# Patient Record
Sex: Male | Born: 1945 | Race: White | Hispanic: No | Marital: Married | Smoking: Former smoker
Health system: Southern US, Community
[De-identification: ages and names within clinical notes are randomized; demographics above are authoritative.]

## PROBLEM LIST (undated history)

## (undated) DIAGNOSIS — J9621 Acute and chronic respiratory failure with hypoxia: Secondary | ICD-10-CM

## (undated) DIAGNOSIS — U071 COVID-19: Secondary | ICD-10-CM

## (undated) DIAGNOSIS — K922 Gastrointestinal hemorrhage, unspecified: Secondary | ICD-10-CM

## (undated) DIAGNOSIS — J449 Chronic obstructive pulmonary disease, unspecified: Secondary | ICD-10-CM

## (undated) HISTORY — PX: AORTIC VALVE REPAIR: SHX6306

---

## 2015-12-01 DIAGNOSIS — Z952 Presence of prosthetic heart valve: Secondary | ICD-10-CM

## 2015-12-01 HISTORY — DX: Presence of prosthetic heart valve: Z95.2

## 2016-05-05 DIAGNOSIS — I359 Nonrheumatic aortic valve disorder, unspecified: Secondary | ICD-10-CM | POA: Diagnosis present

## 2019-06-04 DIAGNOSIS — R911 Solitary pulmonary nodule: Secondary | ICD-10-CM | POA: Insufficient documentation

## 2020-04-28 DIAGNOSIS — I639 Cerebral infarction, unspecified: Secondary | ICD-10-CM

## 2020-04-28 HISTORY — DX: Cerebral infarction, unspecified: I63.9

## 2020-05-26 DIAGNOSIS — F17200 Nicotine dependence, unspecified, uncomplicated: Secondary | ICD-10-CM

## 2020-05-26 DIAGNOSIS — I639 Cerebral infarction, unspecified: Secondary | ICD-10-CM | POA: Insufficient documentation

## 2020-05-26 DIAGNOSIS — C3492 Malignant neoplasm of unspecified part of left bronchus or lung: Secondary | ICD-10-CM | POA: Insufficient documentation

## 2020-05-26 DIAGNOSIS — E871 Hypo-osmolality and hyponatremia: Secondary | ICD-10-CM | POA: Diagnosis present

## 2020-05-26 HISTORY — DX: Nicotine dependence, unspecified, uncomplicated: F17.200

## 2020-05-26 HISTORY — DX: Malignant neoplasm of unspecified part of left bronchus or lung: C34.92

## 2020-05-29 DIAGNOSIS — Z8546 Personal history of malignant neoplasm of prostate: Secondary | ICD-10-CM | POA: Insufficient documentation

## 2020-05-29 HISTORY — DX: Personal history of malignant neoplasm of prostate: Z85.46

## 2020-11-06 ENCOUNTER — Encounter (HOSPITAL_COMMUNITY): Payer: Self-pay

## 2020-11-06 ENCOUNTER — Other Ambulatory Visit: Payer: Self-pay

## 2020-11-06 ENCOUNTER — Emergency Department (HOSPITAL_COMMUNITY): Payer: No Typology Code available for payment source

## 2020-11-06 ENCOUNTER — Inpatient Hospital Stay (HOSPITAL_COMMUNITY)
Admission: EM | Admit: 2020-11-06 | Discharge: 2020-11-12 | DRG: 177 | Disposition: A | Discharge status: Home Health | Payer: No Typology Code available for payment source | Attending: Internal Medicine | Admitting: Internal Medicine

## 2020-11-06 DIAGNOSIS — U071 COVID-19: Secondary | ICD-10-CM | POA: Diagnosis not present

## 2020-11-06 DIAGNOSIS — Z8546 Personal history of malignant neoplasm of prostate: Secondary | ICD-10-CM

## 2020-11-06 DIAGNOSIS — J9601 Acute respiratory failure with hypoxia: Secondary | ICD-10-CM | POA: Diagnosis present

## 2020-11-06 DIAGNOSIS — H409 Unspecified glaucoma: Secondary | ICD-10-CM | POA: Diagnosis present

## 2020-11-06 DIAGNOSIS — E871 Hypo-osmolality and hyponatremia: Secondary | ICD-10-CM | POA: Diagnosis present

## 2020-11-06 DIAGNOSIS — R739 Hyperglycemia, unspecified: Secondary | ICD-10-CM | POA: Diagnosis present

## 2020-11-06 DIAGNOSIS — E861 Hypovolemia: Secondary | ICD-10-CM | POA: Diagnosis not present

## 2020-11-06 DIAGNOSIS — E440 Moderate protein-calorie malnutrition: Secondary | ICD-10-CM | POA: Insufficient documentation

## 2020-11-06 DIAGNOSIS — Z8673 Personal history of transient ischemic attack (TIA), and cerebral infarction without residual deficits: Secondary | ICD-10-CM

## 2020-11-06 DIAGNOSIS — Z7982 Long term (current) use of aspirin: Secondary | ICD-10-CM

## 2020-11-06 DIAGNOSIS — R0600 Dyspnea, unspecified: Secondary | ICD-10-CM | POA: Diagnosis not present

## 2020-11-06 DIAGNOSIS — Z952 Presence of prosthetic heart valve: Secondary | ICD-10-CM

## 2020-11-06 DIAGNOSIS — Z681 Body mass index (BMI) 19 or less, adult: Secondary | ICD-10-CM

## 2020-11-06 DIAGNOSIS — I700 Atherosclerosis of aorta: Secondary | ICD-10-CM | POA: Diagnosis present

## 2020-11-06 DIAGNOSIS — Z880 Allergy status to penicillin: Secondary | ICD-10-CM

## 2020-11-06 DIAGNOSIS — Z85118 Personal history of other malignant neoplasm of bronchus and lung: Secondary | ICD-10-CM

## 2020-11-06 DIAGNOSIS — E222 Syndrome of inappropriate secretion of antidiuretic hormone: Secondary | ICD-10-CM | POA: Diagnosis not present

## 2020-11-06 DIAGNOSIS — J1282 Pneumonia due to coronavirus disease 2019: Secondary | ICD-10-CM | POA: Diagnosis present

## 2020-11-06 DIAGNOSIS — Z888 Allergy status to other drugs, medicaments and biological substances status: Secondary | ICD-10-CM

## 2020-11-06 DIAGNOSIS — J440 Chronic obstructive pulmonary disease with acute lower respiratory infection: Secondary | ICD-10-CM | POA: Diagnosis present

## 2020-11-06 DIAGNOSIS — Z7901 Long term (current) use of anticoagulants: Secondary | ICD-10-CM

## 2020-11-06 DIAGNOSIS — I359 Nonrheumatic aortic valve disorder, unspecified: Secondary | ICD-10-CM | POA: Diagnosis present

## 2020-11-06 DIAGNOSIS — D696 Thrombocytopenia, unspecified: Secondary | ICD-10-CM | POA: Diagnosis present

## 2020-11-06 DIAGNOSIS — F17200 Nicotine dependence, unspecified, uncomplicated: Secondary | ICD-10-CM | POA: Diagnosis present

## 2020-11-06 DIAGNOSIS — Z79899 Other long term (current) drug therapy: Secondary | ICD-10-CM

## 2020-11-06 DIAGNOSIS — Z87891 Personal history of nicotine dependence: Secondary | ICD-10-CM

## 2020-11-06 LAB — BLOOD GAS, VENOUS
Acid-Base Excess: 1.5 mmol/L (ref 0.0–2.0)
Bicarbonate: 24.8 mmol/L (ref 20.0–28.0)
FIO2: 21
O2 Saturation: 85.8 %
Patient temperature: 98.6
pCO2, Ven: 37.1 mmHg — ABNORMAL LOW (ref 44.0–60.0)
pH, Ven: 7.441 — ABNORMAL HIGH (ref 7.250–7.430)
pO2, Ven: 49.9 mmHg — ABNORMAL HIGH (ref 32.0–45.0)

## 2020-11-06 LAB — I-STAT CHEM 8, ED
BUN: 20 mg/dL (ref 8–23)
Calcium, Ion: 1.16 mmol/L (ref 1.15–1.40)
Chloride: 94 mmol/L — ABNORMAL LOW (ref 98–111)
Creatinine, Ser: 0.7 mg/dL (ref 0.61–1.24)
Glucose, Bld: 143 mg/dL — ABNORMAL HIGH (ref 70–99)
HCT: 48 % (ref 39.0–52.0)
Hemoglobin: 16.3 g/dL (ref 13.0–17.0)
Potassium: 3.8 mmol/L (ref 3.5–5.1)
Sodium: 130 mmol/L — ABNORMAL LOW (ref 135–145)
TCO2: 24 mmol/L (ref 22–32)

## 2020-11-06 LAB — CBC WITH DIFFERENTIAL/PLATELET
Abs Immature Granulocytes: 0.02 10*3/uL (ref 0.00–0.07)
Basophils Absolute: 0 10*3/uL (ref 0.0–0.1)
Basophils Relative: 0 %
Eosinophils Absolute: 0 10*3/uL (ref 0.0–0.5)
Eosinophils Relative: 0 %
HCT: 46.8 % (ref 39.0–52.0)
Hemoglobin: 16 g/dL (ref 13.0–17.0)
Immature Granulocytes: 0 %
Lymphocytes Relative: 4 %
Lymphs Abs: 0.2 10*3/uL — ABNORMAL LOW (ref 0.7–4.0)
MCH: 30.5 pg (ref 26.0–34.0)
MCHC: 34.2 g/dL (ref 30.0–36.0)
MCV: 89.1 fL (ref 80.0–100.0)
Monocytes Absolute: 0.3 10*3/uL (ref 0.1–1.0)
Monocytes Relative: 6 %
Neutro Abs: 4.8 10*3/uL (ref 1.7–7.7)
Neutrophils Relative %: 90 %
Platelets: 103 10*3/uL — ABNORMAL LOW (ref 150–400)
RBC: 5.25 MIL/uL (ref 4.22–5.81)
RDW: 14.4 % (ref 11.5–15.5)
WBC: 5.3 10*3/uL (ref 4.0–10.5)
nRBC: 0 % (ref 0.0–0.2)

## 2020-11-06 LAB — COMPREHENSIVE METABOLIC PANEL
ALT: 36 U/L (ref 0–44)
AST: 42 U/L — ABNORMAL HIGH (ref 15–41)
Albumin: 4.2 g/dL (ref 3.5–5.0)
Alkaline Phosphatase: 57 U/L (ref 38–126)
Anion gap: 12 (ref 5–15)
BUN: 19 mg/dL (ref 8–23)
CO2: 23 mmol/L (ref 22–32)
Calcium: 9.2 mg/dL (ref 8.9–10.3)
Chloride: 94 mmol/L — ABNORMAL LOW (ref 98–111)
Creatinine, Ser: 0.8 mg/dL (ref 0.61–1.24)
GFR, Estimated: >60 mL/min (ref >60)
Glucose, Bld: 140 mg/dL — ABNORMAL HIGH (ref 70–99)
Potassium: 3.9 mmol/L (ref 3.5–5.1)
Sodium: 129 mmol/L — ABNORMAL LOW (ref 135–145)
Total Bilirubin: 1.1 mg/dL (ref 0.3–1.2)
Total Protein: 7.1 g/dL (ref 6.5–8.1)

## 2020-11-06 LAB — PROCALCITONIN: Procalcitonin: 2.18 ng/mL

## 2020-11-06 LAB — FERRITIN: Ferritin: 676 ng/mL — ABNORMAL HIGH (ref 24–336)

## 2020-11-06 LAB — RESP PANEL BY RT-PCR (FLU A&B, COVID) ARPGX2
Influenza A by PCR: NEGATIVE
Influenza B by PCR: NEGATIVE
SARS Coronavirus 2 by RT PCR: POSITIVE — AB

## 2020-11-06 LAB — TRIGLYCERIDES: Triglycerides: 76 mg/dL (ref <150)

## 2020-11-06 LAB — D-DIMER, QUANTITATIVE: D-Dimer, Quant: 0.33 ug/mL-FEU (ref 0.00–0.50)

## 2020-11-06 LAB — FIBRINOGEN: Fibrinogen: 615 mg/dL — ABNORMAL HIGH (ref 210–475)

## 2020-11-06 LAB — LACTATE DEHYDROGENASE: LDH: 313 U/L — ABNORMAL HIGH (ref 98–192)

## 2020-11-06 LAB — LACTIC ACID, PLASMA
Lactic Acid, Venous: 1.3 mmol/L (ref 0.5–1.9)
Lactic Acid, Venous: 1.1 mmol/L (ref 0.5–1.9)

## 2020-11-06 LAB — C-REACTIVE PROTEIN: CRP: 17.4 mg/dL — ABNORMAL HIGH (ref <1.0)

## 2020-11-06 MED ORDER — ASCORBIC ACID 500 MG PO TABS
500.0000 mg | ORAL_TABLET | Freq: Every day | ORAL | Status: DC
  Administered 2020-11-07 – 2020-11-12 (×6): 500 mg via ORAL
  Filled 2020-11-06 (×6): qty 1

## 2020-11-06 MED ORDER — ZINC SULFATE 220 (50 ZN) MG PO CAPS
220.0000 mg | ORAL_CAPSULE | Freq: Every day | ORAL | Status: DC
  Administered 2020-11-07 – 2020-11-12 (×6): 220 mg via ORAL
  Filled 2020-11-06 (×6): qty 1

## 2020-11-06 MED ORDER — ACETAMINOPHEN 650 MG RE SUPP: 650.0000 mg | Freq: Four times a day (QID) | RECTAL | Status: DC | PRN

## 2020-11-06 MED ORDER — HYDROCOD POLST-CPM POLST ER 10-8 MG/5ML PO SUER: 5.0000 mL | Freq: Two times a day (BID) | ORAL | Status: DC | PRN

## 2020-11-06 MED ORDER — DEXAMETHASONE SODIUM PHOSPHATE 4 MG/ML IJ SOLN
4.0000 mg | Freq: Once | INTRAMUSCULAR | Status: AC
  Administered 2020-11-06: 4 mg via INTRAVENOUS
  Filled 2020-11-06: qty 1

## 2020-11-06 MED ORDER — SODIUM CHLORIDE 0.9 % IV SOLN
100.0000 mg | Freq: Every day | INTRAVENOUS | Status: DC
  Filled 2020-11-06: qty 20

## 2020-11-06 MED ORDER — ACETAMINOPHEN 325 MG PO TABS
650.0000 mg | ORAL_TABLET | Freq: Four times a day (QID) | ORAL | Status: DC | PRN
  Administered 2020-11-09: 650 mg via ORAL
  Filled 2020-11-06: qty 2

## 2020-11-06 MED ORDER — DEXAMETHASONE SODIUM PHOSPHATE 10 MG/ML IJ SOLN
6.0000 mg | INTRAMUSCULAR | Status: DC
  Administered 2020-11-07: 10:00:00 6 mg via INTRAVENOUS
  Filled 2020-11-06: qty 1

## 2020-11-06 MED ORDER — ALBUTEROL SULFATE HFA 108 (90 BASE) MCG/ACT IN AERS
2.0000 | INHALATION_SPRAY | Freq: Four times a day (QID) | RESPIRATORY_TRACT | Status: DC
  Administered 2020-11-07 – 2020-11-12 (×18): 2 via RESPIRATORY_TRACT
  Filled 2020-11-06: qty 6.7

## 2020-11-06 MED ORDER — ENOXAPARIN SODIUM 40 MG/0.4ML ~~LOC~~ SOLN: 40.0000 mg | SUBCUTANEOUS | Status: DC

## 2020-11-06 MED ORDER — GUAIFENESIN-DM 100-10 MG/5ML PO SYRP: 10.0000 mL | ORAL_SOLUTION | ORAL | Status: DC | PRN

## 2020-11-06 MED ORDER — LEVOFLOXACIN IN D5W 750 MG/150ML IV SOLN
750.0000 mg | INTRAVENOUS | Status: AC
  Administered 2020-11-06 – 2020-11-10 (×5): 750 mg via INTRAVENOUS
  Filled 2020-11-06 (×5): qty 150

## 2020-11-06 MED ORDER — SODIUM CHLORIDE 0.9 % IV SOLN
200.0000 mg | Freq: Once | INTRAVENOUS | Status: AC
  Administered 2020-11-07: 02:00:00 200 mg via INTRAVENOUS
  Filled 2020-11-06: qty 40

## 2020-11-06 MED ORDER — ONDANSETRON HCL 4 MG PO TABS: 4.0000 mg | ORAL_TABLET | Freq: Four times a day (QID) | ORAL | Status: DC | PRN

## 2020-11-06 MED ORDER — ALBUTEROL SULFATE HFA 108 (90 BASE) MCG/ACT IN AERS
2.0000 | INHALATION_SPRAY | Freq: Once | RESPIRATORY_TRACT | Status: AC
  Administered 2020-11-06: 2 via RESPIRATORY_TRACT
  Filled 2020-11-06: qty 6.7

## 2020-11-06 MED ORDER — ONDANSETRON HCL 4 MG/2ML IJ SOLN: 4.0000 mg | Freq: Four times a day (QID) | INTRAMUSCULAR | Status: DC | PRN

## 2020-11-06 NOTE — Progress Notes (Signed)
PHARMACY NOTE:  [facility] [place] dosing request to investigate Cephalosporin use for CAP, if has tolerated - may change to Rocephin/Azithromycin.   No documented Cephalosporin use, or any antibiotic, per PTA medication list, Care Everywhere  Levaquin 750mg  IV q24 x 5 days ordered,  Dose appropriate for renal function  Minda Ditto PharmD 11/06/2020, 11:32 PM

## 2020-11-06 NOTE — ED Notes (Addendum)
Called and updated wife, Jamie Young, and she wants the Pharmacy Tech to call her in regards to patients medication list.  Cell [phone] Home [phone]  Pharmacy Tech updated.

## 2020-11-06 NOTE — ED Provider Notes (Signed)
[facility] DEPT Provider Note   CSN: [id number] Arrival date & time: 11/06/20  1931     History Chief Complaint  Patient presents with  . Shortness of Breath    Jamie Young is a 74 y.o. male hx of stroke on Plavix, here presenting with hypoxia with Covid.  Patient states that his wife came out from [facility] about a week ago to stay with him.  He states that he has home test that was positive today.  He has been having worsening shortness of breath and low-grade temperature 100 since yesterday.  His wife tested his oxygen level and was 80s.  Patient is not on oxygen at baseline.  Patient was given Tylenol prior to arrival.  Patient did not receive the Covid vaccines.  The history is provided by the patient.       Past Medical History:  Diagnosis Date  . Stroke [facility]) 04/2020    Patient Active Problem List   Diagnosis Date Noted  . Acute dyspnea 11/06/2020    History reviewed. No pertinent surgical history.     History reviewed. No pertinent family history.  Social History   Tobacco Use  . Smoking status: Former Research scientist (life sciences)  . Smokeless tobacco: Never Used    Home Medications Prior to Admission medications   Medication Sig Start Date End Date Taking? Authorizing Provider  Ascorbic Acid (VITAMIN C) 1000 MG tablet Take 1,000 mg by mouth daily.   Yes [provider]  aspirin 81 MG chewable tablet Chew 81 mg by mouth daily.   Yes [provider]  Carboxymethylcellulose Sod PF 1 % GEL Place 1 drop into both eyes at bedtime.   Yes [provider]  Cholecalciferol 50 MCG (2000 UT) TABS Take 2,000 Units by mouth daily.   Yes [provider]  dorzolamidel-timolol (COSOPT) 22.3-6.8 MG/ML SOLN ophthalmic solution Place 1 drop into both eyes 2 (two) times daily.   Yes [provider]  latanoprost (XALATAN) 0.005 % ophthalmic solution Place 1 drop into both eyes at bedtime.   Yes [provider]   warfarin (COUMADIN) 5 MG tablet Take 7.5 mg by mouth See admin instructions. Take 1.5 tablets (7.5 mg ) by mouth once a day except 2 tablets (10 mg) on Wednesday and Saturday   Yes [provider]    Allergies    Penicillin g and Lisinopril  Review of Systems   Review of Systems  Respiratory: Positive for shortness of breath.   All other systems reviewed and are negative.   Physical Exam Updated Vital Signs BP (!) 146/64   Pulse (!) 102   Temp 99.7 F (37.6 C) (Oral)   Resp (!) 29   Ht 5\' 11"  (1.803 m)   Wt 63.5 kg   SpO2 94%   BMI 19.53 kg/m   Physical Exam Vitals and nursing note reviewed.  Constitutional:      Comments: tachypneic   HENT:     Head: Normocephalic.     Mouth/Throat:     Mouth: Mucous membranes are moist.     Pharynx: Oropharynx is clear.  Eyes:     Extraocular Movements: Extraocular movements intact.     Pupils: Pupils are equal, round, and reactive to light.  Cardiovascular:     Rate and Rhythm: Normal rate and regular rhythm.  Pulmonary:     Comments: Tachypneic, diminished bilaterally  Abdominal:     General: Bowel sounds are normal.     Palpations: Abdomen is soft.  Musculoskeletal:        General: Normal range of motion.     Cervical back: Normal range of motion and neck supple.  Skin:    General: Skin is warm.     Capillary Refill: Capillary refill takes less than 2 seconds.  Neurological:     General: No focal deficit present.     Mental Status: He is alert and oriented to person, place, and time.  Psychiatric:        Mood and Affect: Mood normal.        Behavior: Behavior normal.     ED Results / Procedures / Treatments   Labs (all labs ordered are listed, but only abnormal results are displayed) Labs Reviewed  CBC WITH DIFFERENTIAL/PLATELET - Abnormal; Notable for the following components:      Result Value   Platelets 103 (*)    Lymphs Abs 0.2 (*)    All other components within normal limits  COMPREHENSIVE  METABOLIC PANEL - Abnormal; Notable for the following components:   Sodium 129 (*)    Chloride 94 (*)    Glucose, Bld 140 (*)    AST 42 (*)    All other components within normal limits  LACTATE DEHYDROGENASE - Abnormal; Notable for the following components:   LDH 313 (*)    All other components within normal limits  FERRITIN - Abnormal; Notable for the following components:   Ferritin 676 (*)    All other components within normal limits  FIBRINOGEN - Abnormal; Notable for the following components:   Fibrinogen 615 (*)    All other components within normal limits  C-REACTIVE PROTEIN - Abnormal; Notable for the following components:   CRP 17.4 (*)    All other components within normal limits  BLOOD GAS, VENOUS - Abnormal; Notable for the following components:   pH, Ven 7.441 (*)    pCO2, Ven 37.1 (*)    pO2, Ven 49.9 (*)    All other components within normal limits  I-STAT CHEM 8, ED - Abnormal; Notable for the following components:   Sodium 130 (*)    Chloride 94 (*)    Glucose, Bld 143 (*)    All other components within normal limits  RESP PANEL BY RT-PCR (FLU A&B, COVID) ARPGX2  CULTURE, BLOOD (ROUTINE X 2)  CULTURE, BLOOD (ROUTINE X 2)  LACTIC ACID, PLASMA  D-DIMER, QUANTITATIVE (NOT AT [facility])  TRIGLYCERIDES  LACTIC ACID, PLASMA  PROCALCITONIN    EKG None  Radiology DG Chest Port 1 View  Result Date: 11/06/2020 CLINICAL DATA:  Short of breath EXAM: PORTABLE CHEST 1 VIEW COMPARISON:  None. FINDINGS: Post sternotomy changes. Mild reticular opacity at the left base, possible mild fibrosis. No acute consolidation or effusion. Normal heart size. Aortic atherosclerosis. No pneumothorax. Clips at the upper mediastinum. IMPRESSION: 1. No focal airspace disease 2. Possible reticular changes/mild fibrosis at the left base Electronically Signed   By: Donavan Foil M.D.   On: 11/06/2020 20:23    Procedures Procedures (including critical care time)  CRITICAL CARE Performed by:  Wandra Arthurs   Total critical care time: 30 minutes  Critical care time was exclusive of separately billable procedures and treating other patients.  Critical care was necessary to treat or prevent imminent or life-threatening deterioration.  Critical care was time spent personally by me on the following activities: development of treatment plan with patient and/or surrogate as well as nursing, discussions with consultants, evaluation of patient's response to treatment, examination of  patient, obtaining history from patient or surrogate, ordering and performing treatments and interventions, ordering and review of laboratory studies, ordering and review of radiographic studies, pulse oximetry and re-evaluation of patient's condition.   Medications Ordered in ED Medications  dexamethasone (DECADRON) injection 4 mg (4 mg Intravenous Given 11/06/20 2118)  albuterol (VENTOLIN HFA) 108 (90 Base) MCG/ACT inhaler 2 puff (2 puffs Inhalation Given 11/06/20 2117)    ED Course  I have reviewed the triage vital signs and the nursing notes.  Pertinent labs & imaging results that were available during my care of the patient were reviewed by me and considered in my medical decision making (see chart for details).    MDM Rules/Calculators/A&P                         Jamie Young is a 74 y.o. male here presenting with shortness of breath.  Patient has a positive home test.  Patient is hypoxic in the 80s.  Patient did not receive the Covid vaccine.  Plan to get Covid preadmission labs and test for Covid and get chest x-ray.  Patient will need admission for Covid with hypoxia.   10:46 PM Inflammatory markers elevated. CXR unremarkable. Placed on 2 L [place]. Hospitalist to admit for COVID with hypoxia    Final Clinical Impression(s) / ED Diagnoses Final diagnoses:  None    Rx / DC Orders ED Discharge Orders    None       Drenda Freeze, MD 11/06/20 2247

## 2020-11-06 NOTE — ED Triage Notes (Addendum)
Patient BIB [facility] coming from home. Chief complaint SOB, fever since yesterday. Covid positive today. History of COPD. Wife monitors O2 sats closely. Patient sats in high 80s. EMS put him on 2L [place].   EMS gave 1000mg  of Tylenol and fever 104F. EMS last temp 100.41F.   EMS vitals 149/82 HR 116 O2 94% CBG 153  Wifes phone number Pam [phone]

## 2020-11-06 NOTE — H&P (Signed)
History and Physical    Jamie Young GLO:[id number] DOB: 12-10-1945 DOA: 11/06/2020  PCP: [facility]   Patient coming from: Home.  I have personally briefly reviewed patient's old medical records in [place]  Chief Complaint: Shortness of breath.  HPI: Jamie Young is a 74 y.o. male with medical history significant of artificial heart valve, prostate cancer, history of other nonhemorrhagic stroke, COPD, former tobacco use who is coming to the emergency department due to progressively worse dyspnea and fever since yesterday.  Earlier this week, the patient had a 4-5 episodes of diarrhea.  He denies abdominal pain, emesis, melena or hematochezia.  No dysuria, frequency or hematuria. No chest pain, palpitations, diaphoresis, PND, orthopnea or pitting edema of the lower extremities.   He mentions that his wife came from [facility] to stay with him had, but developed URI symptoms of and subsequently tested positive for COVID-19.  He tested positive for Covid on the home kit.    ED Course: Initial vital signs were 99.7 F, pulse 107, respiration 15, BP 152/65 mmHg and O2 sat 94% on nasal cannula oxygen.  The patient received dexamethasone 4 mg IVP in the emergency department.  Lab work: CBC showed white count 5.3, ammonia 16.0 g/dL platelets 103.  D-dimer was 0.33 and fibrinogen 615.  Ferritin was 676 and procalcitonin 2.18 ng/mL.  CRP was 17.4 mg/dL.  Lactic acid was normal.  LDH 313.  Sodium 129 and chloride 94 mmol/L.  Glucose 140 mg/dL and AST 42 units/L.  The rest of the CMP values are within expected range.  Coronavirus 2 PCR was positive.  Imaging: A portable 1 view chest radiograph show reticular changes mild fibrosis in the left base, but there was no focal airspace disease.  Please see image and full detailed report for further detail.  Review of Systems: As per HPI otherwise all other systems reviewed and are negative.  Past Medical History:  Diagnosis Date  .  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 Aortic mechanical  . 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 Personal history of prostate cancer 05/29/2020  . Stroke ([place]) 04/2020  . Tobacco use disorder 05/26/2020    Past surgical history Aortic valve repair.  Social History  reports that he has quit smoking. He has never used smokeless tobacco. No history on file for alcohol use and drug use.  Allergies  Allergen Reactions  . Penicillin G Other (See Comments)    Unknown (childhood)  . Lisinopril Other (See Comments)    Varified By [facility] unknown   Family medical history. Father had bladder and lung cancer.  Prior to Admission medications   Medication Sig Start Date End Date Taking? Authorizing Provider  Ascorbic Acid (VITAMIN C) 1000 MG tablet Take 1,000 mg by mouth daily.   Yes [provider]  aspirin 81 MG chewable tablet Chew 81 mg by mouth daily.   Yes [provider]  Carboxymethylcellulose Sod PF 1 % GEL Place 1 drop into both eyes at bedtime.   Yes [provider]  Cholecalciferol 50 MCG (2000 UT) TABS Take 2,000 Units by mouth daily.   Yes [provider]  dorzolamidel-timolol (COSOPT) 22.3-6.8 MG/ML SOLN ophthalmic solution Place 1 drop into both eyes 2 (two) times daily.   Yes [provider]  latanoprost (XALATAN) 0.005 % ophthalmic solution Place 1 drop into both eyes at bedtime.   Yes [provider]  warfarin (COUMADIN) 5 MG tablet Take 7.5 mg by mouth See admin instructions. Take 1.5 tablets (7.5 mg ) by mouth once a day except 2 tablets (10 mg) on Wednesday and Saturday   Yes [provider]   Physical Exam: Vitals:   11/06/20 2215 11/06/20 2230 11/06/20 2300 11/06/20 2330  BP:  (!) 146/64 (!) 143/60 136/63  Pulse: (!) 108 (!) 102 (!) 103 95  Resp: (!) 30 (!) 29 (!) 21 20  Temp:      TempSrc:      SpO2: 93% 94% 95% 93%  Weight:       Height:       Constitutional: Looks acutely ill, but currently in NAD. Eyes: PERRL, lids and conjunctivae mildly injected. ENMT: Mucous membranes are moist. Posterior pharynx clear of any exudate or lesions. Neck: normal, supple, no masses, no thyromegaly Respiratory: Mildly tachypneic in the low 20s.  Bibasilar crackles.  No wheezing or rhonchi.  No accessory muscle use.  Cardiovascular: Regular rate and rhythm, no murmurs / rubs / gallops. No extremity edema. 2+ pedal pulses. No carotid bruits.  Abdomen: Nondistended.  Bowel sounds positive, soft, no tenderness, no masses palpated. No hepatosplenomegaly. Musculoskeletal: no clubbing / cyanosis.  Good ROM, no contractures. Normal muscle tone.  Skin: Some small areas of ecchymosis.  Otherwise no rashes, lesions, ulcers on very limited dermatological examination. Neurologic: CN 2-12 grossly intact. Sensation intact, DTR normal. Strength 5/5 in all 4.  Psychiatric: Normal judgment and insight. Alert and oriented x 3. Normal mood.   Labs on Admission: I have personally reviewed following labs and imaging studies  CBC: Recent Labs  Lab 11/06/20 1956 11/06/20 2127  WBC 5.3  --   NEUTROABS 4.8  --   HGB 16.0 16.3  HCT 46.8 48.0  MCV 89.1  --   PLT 103*  --     Basic Metabolic Panel: Recent Labs  Lab 11/06/20 1956 11/06/20 2127  NA 129* 130*  K 3.9 3.8  CL 94* 94*  CO2 23  --   GLUCOSE 140* 143*  BUN 19 20  CREATININE 0.80 0.70  CALCIUM 9.2  --     GFR: Estimated Creatinine Clearance: 73.9 mL/min (by C-G formula based on SCr of 0.7 mg/dL).  Liver Function Tests: Recent Labs  Lab 11/06/20 1956  AST 42*  ALT 36  ALKPHOS 57  BILITOT 1.1  PROT 7.1  ALBUMIN 4.2    Urine analysis: No results found for: COLORURINE, APPEARANCEUR, LABSPEC, PHURINE, GLUCOSEU, HGBUR, BILIRUBINUR, KETONESUR, PROTEINUR, UROBILINOGEN, NITRITE, LEUKOCYTESUR  Radiological Exams on Admission: DG Chest Port 1 View  Result Date:  11/06/2020 CLINICAL DATA:  Short of breath EXAM: PORTABLE CHEST 1 VIEW COMPARISON:  None. FINDINGS: Post sternotomy changes. Mild reticular opacity at the left base, possible mild fibrosis. No acute consolidation or effusion. Normal heart size. Aortic atherosclerosis. No pneumothorax. Clips at the upper mediastinum. IMPRESSION: 1. No focal airspace disease 2. Possible reticular changes/mild fibrosis at the left base Electronically Signed   By: Donavan Foil M.D.   On: 11/06/2020 20:23    EKG: Independently reviewed.  Assessment/Plan Principal Problem:    Pneumonia due to COVID-19 virus Observation/telemetry. Continue supplemental oxygen. Bronchodilators as needed. Incentive spirometry/flutter valve. Remdesivir per pharmacy. Continue dexamethasone 6 mg IVP daily. Ceftriaxone/Zithromax due to elevated procalcitonin. Follow-up CBC, CMP and inflammatory markers.  Active Problems:   Hyponatremia Close to baseline. Follow sodium level. Consider fluid restriction if worsens.    Aortic valve disorder Warfarin per pharmacy.  Aortic atherosclerosis (HCC) Declined statin therapy. Advised to discuss later with PCP.    Thrombocytopenia ([place]) Monitor closely. Discontinue Lovenox if it decreases below 100 K.    Glaucoma Continue Cosopt and Xalatan drops.    Hyperglycemia Follow-up fasting glucose. Further work-up depending on result.    DVT prophylaxis: Lovenox SQ. Code Status:   Full code. Family Communication: Disposition Plan:   Patient is from:  Home.  Anticipated DC to:  Home.  Anticipated DC date:  11/08/2020.  Anticipated DC barriers: Clinical status.  Consults called: Admission status:  Observation/telemetry.    Severity of Illness:High due to dyspnea with hypoxia, fatigue, malaise, decreased appetite, diarrhea in the setting of COVID-19 disease/pneumonia.  The patient needs to be hospitalized for oxygen supplementation, glucocorticoid and antiviral therapy.  Reubin Milan MD [facility]  How to contact the [facility] Attending or Consulting provider [place] or covering provider during after hours [place], for this patient?   1. Check the care team in [facility] and look for a) attending/consulting TR[facility] provider listed and b) the [facility] team listed 2. Log into [email or web address] and use [place]'s universal password to access. If you do not have the password, please contact the hospital operator. 3. Locate the [facility] provider you are looking for under [facility] and page to a number that you can be directly reached. 4. If you still have difficulty reaching the provider, please page the [facility] (Director on Call) for the Hospitalists listed on amion for assistance.  11/06/2020, 11:39 PM   This document was prepared using Dragon voice recognition software and may contain some unintended transcription errors.

## 2020-11-07 ENCOUNTER — Inpatient Hospital Stay (HOSPITAL_COMMUNITY): Payer: No Typology Code available for payment source

## 2020-11-07 ENCOUNTER — Other Ambulatory Visit: Payer: Self-pay

## 2020-11-07 DIAGNOSIS — R0600 Dyspnea, unspecified: Secondary | ICD-10-CM | POA: Diagnosis present

## 2020-11-07 DIAGNOSIS — U071 COVID-19: Secondary | ICD-10-CM | POA: Diagnosis present

## 2020-11-07 DIAGNOSIS — Z79899 Other long term (current) drug therapy: Secondary | ICD-10-CM | POA: Diagnosis not present

## 2020-11-07 DIAGNOSIS — I639 Cerebral infarction, unspecified: Secondary | ICD-10-CM | POA: Diagnosis not present

## 2020-11-07 DIAGNOSIS — I700 Atherosclerosis of aorta: Secondary | ICD-10-CM

## 2020-11-07 DIAGNOSIS — J440 Chronic obstructive pulmonary disease with acute lower respiratory infection: Secondary | ICD-10-CM | POA: Diagnosis present

## 2020-11-07 DIAGNOSIS — Z85118 Personal history of other malignant neoplasm of bronchus and lung: Secondary | ICD-10-CM | POA: Diagnosis not present

## 2020-11-07 DIAGNOSIS — Z8546 Personal history of malignant neoplasm of prostate: Secondary | ICD-10-CM | POA: Diagnosis not present

## 2020-11-07 DIAGNOSIS — J1282 Pneumonia due to coronavirus disease 2019: Secondary | ICD-10-CM | POA: Diagnosis present

## 2020-11-07 DIAGNOSIS — D696 Thrombocytopenia, unspecified: Secondary | ICD-10-CM | POA: Diagnosis present

## 2020-11-07 DIAGNOSIS — R739 Hyperglycemia, unspecified: Secondary | ICD-10-CM | POA: Diagnosis present

## 2020-11-07 DIAGNOSIS — Z7982 Long term (current) use of aspirin: Secondary | ICD-10-CM | POA: Diagnosis not present

## 2020-11-07 DIAGNOSIS — E440 Moderate protein-calorie malnutrition: Secondary | ICD-10-CM | POA: Diagnosis present

## 2020-11-07 DIAGNOSIS — I359 Nonrheumatic aortic valve disorder, unspecified: Secondary | ICD-10-CM | POA: Diagnosis present

## 2020-11-07 DIAGNOSIS — Z8673 Personal history of transient ischemic attack (TIA), and cerebral infarction without residual deficits: Secondary | ICD-10-CM | POA: Diagnosis not present

## 2020-11-07 DIAGNOSIS — E222 Syndrome of inappropriate secretion of antidiuretic hormone: Secondary | ICD-10-CM | POA: Diagnosis not present

## 2020-11-07 DIAGNOSIS — Z87891 Personal history of nicotine dependence: Secondary | ICD-10-CM | POA: Diagnosis not present

## 2020-11-07 DIAGNOSIS — Z7901 Long term (current) use of anticoagulants: Secondary | ICD-10-CM | POA: Diagnosis not present

## 2020-11-07 DIAGNOSIS — Z952 Presence of prosthetic heart valve: Secondary | ICD-10-CM | POA: Diagnosis not present

## 2020-11-07 DIAGNOSIS — H409 Unspecified glaucoma: Secondary | ICD-10-CM | POA: Diagnosis present

## 2020-11-07 DIAGNOSIS — Z888 Allergy status to other drugs, medicaments and biological substances status: Secondary | ICD-10-CM | POA: Diagnosis not present

## 2020-11-07 DIAGNOSIS — Z880 Allergy status to penicillin: Secondary | ICD-10-CM | POA: Diagnosis not present

## 2020-11-07 DIAGNOSIS — E871 Hypo-osmolality and hyponatremia: Secondary | ICD-10-CM | POA: Diagnosis not present

## 2020-11-07 DIAGNOSIS — Z681 Body mass index (BMI) 19 or less, adult: Secondary | ICD-10-CM | POA: Diagnosis not present

## 2020-11-07 DIAGNOSIS — J9601 Acute respiratory failure with hypoxia: Secondary | ICD-10-CM | POA: Diagnosis present

## 2020-11-07 DIAGNOSIS — E861 Hypovolemia: Secondary | ICD-10-CM | POA: Diagnosis not present

## 2020-11-07 LAB — COMPREHENSIVE METABOLIC PANEL
ALT: 30 U/L (ref 0–44)
AST: 38 U/L (ref 15–41)
Albumin: 3.4 g/dL — ABNORMAL LOW (ref 3.5–5.0)
Alkaline Phosphatase: 44 U/L (ref 38–126)
Anion gap: 10 (ref 5–15)
BUN: 17 mg/dL (ref 8–23)
CO2: 22 mmol/L (ref 22–32)
Calcium: 8.3 mg/dL — ABNORMAL LOW (ref 8.9–10.3)
Chloride: 96 mmol/L — ABNORMAL LOW (ref 98–111)
Creatinine, Ser: 0.71 mg/dL (ref 0.61–1.24)
GFR, Estimated: >60 mL/min (ref >60)
Glucose, Bld: 138 mg/dL — ABNORMAL HIGH (ref 70–99)
Potassium: 3.9 mmol/L (ref 3.5–5.1)
Sodium: 128 mmol/L — ABNORMAL LOW (ref 135–145)
Total Bilirubin: 1.1 mg/dL (ref 0.3–1.2)
Total Protein: 6.2 g/dL — ABNORMAL LOW (ref 6.5–8.1)

## 2020-11-07 LAB — CBC WITH DIFFERENTIAL/PLATELET
Abs Immature Granulocytes: 0.01 10*3/uL (ref 0.00–0.07)
Basophils Absolute: 0 10*3/uL (ref 0.0–0.1)
Basophils Relative: 0 %
Eosinophils Absolute: 0 10*3/uL (ref 0.0–0.5)
Eosinophils Relative: 0 %
HCT: 42.1 % (ref 39.0–52.0)
Hemoglobin: 14.4 g/dL (ref 13.0–17.0)
Immature Granulocytes: 0 %
Lymphocytes Relative: 4 %
Lymphs Abs: 0.3 10*3/uL — ABNORMAL LOW (ref 0.7–4.0)
MCH: 30.1 pg (ref 26.0–34.0)
MCHC: 34.2 g/dL (ref 30.0–36.0)
MCV: 88.1 fL (ref 80.0–100.0)
Monocytes Absolute: 0.2 10*3/uL (ref 0.1–1.0)
Monocytes Relative: 3 %
Neutro Abs: 6.2 10*3/uL (ref 1.7–7.7)
Neutrophils Relative %: 93 %
Platelets: 96 10*3/uL — ABNORMAL LOW (ref 150–400)
RBC: 4.78 MIL/uL (ref 4.22–5.81)
RDW: 14.3 % (ref 11.5–15.5)
WBC: 6.8 10*3/uL (ref 4.0–10.5)
nRBC: 0 % (ref 0.0–0.2)

## 2020-11-07 LAB — PROTIME-INR
INR: 5.6 (ref 0.8–1.2)
Prothrombin Time: 48.8 seconds — ABNORMAL HIGH (ref 11.4–15.2)

## 2020-11-07 LAB — MAGNESIUM: Magnesium: 1.5 mg/dL — ABNORMAL LOW (ref 1.7–2.4)

## 2020-11-07 LAB — C-REACTIVE PROTEIN: CRP: 19 mg/dL — ABNORMAL HIGH (ref <1.0)

## 2020-11-07 LAB — PHOSPHORUS: Phosphorus: 2.3 mg/dL — ABNORMAL LOW (ref 2.5–4.6)

## 2020-11-07 LAB — D-DIMER, QUANTITATIVE: D-Dimer, Quant: 0.47 ug/mL-FEU (ref 0.00–0.50)

## 2020-11-07 LAB — FERRITIN: Ferritin: 472 ng/mL — ABNORMAL HIGH (ref 24–336)

## 2020-11-07 LAB — STREP PNEUMONIAE URINARY ANTIGEN: Strep Pneumo Urinary Antigen: NEGATIVE

## 2020-11-07 MED ORDER — DORZOLAMIDE HCL-TIMOLOL MAL PF 22.3-6.8 MG/ML OP SOLN
1.0000 [drp] | Freq: Two times a day (BID) | OPHTHALMIC | Status: DC
  Administered 2020-11-07: 1 [drp] via OPHTHALMIC
  Filled 2020-11-07 (×2): qty 0.1

## 2020-11-07 MED ORDER — DORZOLAMIDE HCL-TIMOLOL MAL 2-0.5 % OP SOLN
1.0000 [drp] | Freq: Two times a day (BID) | OPHTHALMIC | Status: DC
  Administered 2020-11-07 – 2020-11-12 (×10): 1 [drp] via OPHTHALMIC
  Filled 2020-11-07: qty 10

## 2020-11-07 MED ORDER — WARFARIN - PHARMACIST DOSING INPATIENT: Freq: Every day | Status: DC

## 2020-11-07 MED ORDER — ASPIRIN 81 MG PO CHEW
81.0000 mg | CHEWABLE_TABLET | Freq: Every day | ORAL | Status: DC
  Administered 2020-11-07 – 2020-11-12 (×6): 81 mg via ORAL
  Filled 2020-11-07 (×6): qty 1

## 2020-11-07 MED ORDER — SODIUM CHLORIDE 0.9 % IV SOLN: 100.0000 mg | Freq: Every day | INTRAVENOUS | Status: DC

## 2020-11-07 MED ORDER — LATANOPROST 0.005 % OP SOLN
1.0000 [drp] | Freq: Every day | OPHTHALMIC | Status: DC
  Administered 2020-11-09 – 2020-11-11 (×4): 1 [drp] via OPHTHALMIC
  Filled 2020-11-07: qty 2.5

## 2020-11-07 MED ORDER — MAGNESIUM SULFATE 2 GM/50ML IV SOLN
2.0000 g | Freq: Once | INTRAVENOUS | Status: AC
  Administered 2020-11-07: 10:00:00 2 g via INTRAVENOUS
  Filled 2020-11-07: qty 50

## 2020-11-07 MED ORDER — SODIUM CHLORIDE 0.9 % IV SOLN: INTRAVENOUS | Status: AC

## 2020-11-07 MED ORDER — METHYLPREDNISOLONE SODIUM SUCC 125 MG IJ SOLR
60.0000 mg | Freq: Two times a day (BID) | INTRAMUSCULAR | Status: DC
  Administered 2020-11-07 – 2020-11-09 (×4): 60 mg via INTRAVENOUS
  Filled 2020-11-07 (×4): qty 2

## 2020-11-07 MED ORDER — POLYVINYL ALCOHOL 1.4 % OP SOLN
1.0000 [drp] | Freq: Every day | OPHTHALMIC | Status: DC
  Administered 2020-11-09 – 2020-11-11 (×3): 1 [drp] via OPHTHALMIC
  Filled 2020-11-07: qty 15

## 2020-11-07 NOTE — Progress Notes (Signed)
Jamie Young for Warfarin Indication: mechanical AVR  Allergies  Allergen Reactions  . Penicillin G Other (See Comments)    Unknown (childhood)  . Lisinopril Other (See Comments)    Varified By [facility] unknown    Patient Measurements: Height: 5\' 11"  (180.3 cm) Weight: 63.5 kg (140 lb) IBW/kg (Calculated) : 75.3  Vital Signs: BP: 159/52 (12/11 0830) Pulse Rate: 99 (12/11 0830)  Labs: Recent Labs    11/06/20 1956 11/06/20 2127 11/07/20 0408  HGB 16.0 16.3 14.4  HCT 46.8 48.0 42.1  PLT 103*  --  96*  LABPROT  --   --  48.8*  INR  --   --  5.6*  CREATININE 0.80 0.70 0.71    Estimated Creatinine Clearance: 73.9 mL/min (by C-G formula based on SCr of 0.71 mg/dL).  Medications:  (Not in a hospital admission)   Assessment: 31 yoM with PMH mech AVR on warfarin, prostate CA, CVA, COPD, admitted 12/10 for COVID PNA. Pharmacy to continue warfarin while admitted.   Baseline INR elevated  Prior anticoagulation: warfarin 10mg  Wed/Sat; 7.5 mg all other days; LD 12/10  Significant events:  Today, 11/07/2020:  CBC: Hgb wnl; Plt low (baseline 130-150 during July admission in [place])  INR SUPRAtherapeutic  Major drug interactions: Levofloxacin  No bleeding issues per nursing  PO intake not yet charted  Goal of Therapy: INR 2.5-3.5  Plan:  Hold warfarin today  Daily INR  CBC at least q72 hr while on warfarin  Monitor for signs of bleeding or thrombosis   Reuel Boom, PharmD, BCPS [phone] 11/07/2020, 9:33 AM

## 2020-11-07 NOTE — ED Notes (Signed)
CT here to take pt for scan

## 2020-11-07 NOTE — ED Notes (Signed)
Hospitalist at bedside 

## 2020-11-07 NOTE — ED Notes (Addendum)
Pt changed into gown from clothes that had soiled in urine when couldn't get to urinal fast enough.  Sheets changed, new sock applied as well as warm blankets. Also applied male primofit.   Lunch tray set up.

## 2020-11-07 NOTE — Progress Notes (Signed)
PHARMACY NOTE -  Jamie Young has been assisting with dosing of levofloxacin for CAP.  Dosage remains stable at 750 mg IV q24 hr and need for further dosage adjustment appears unlikely at present given baseline renal function  Patient has no documented administration in Epic/Care Everywhere of any kind of antibiotic, thus no documented Hx of cephalosporins or other beta-lactams. Based on remote childhood reaction however, a 3G cephalosporin challenge seems reasonable, unless patient provides new information to suggest allergy severe or persistent.   Pharmacy will sign off, following peripherally for culture results or dose adjustments. Please reconsult if a change in clinical status warrants re-evaluation of dosage.  Jamie Young, PharmD, BCPS [phone] 11/07/2020, 9:44 AM

## 2020-11-07 NOTE — Progress Notes (Addendum)
PROGRESS NOTE  Jamie Young  YWV:[id number] DOB: 07/30/1946 DOA: 11/06/2020 PCP: [facility], [facility]  Brief Narrative: Jamie Young is a 74 y.o. 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 He had low grade fever, tachycardia, and hypoxia. CRP was 17.4, PCT 2.18, and SARS-CoV-2 PCR confirmed to be positive. CXR revealed fibrotic changes without lobar consolidation. INR noted to be 5.6  Assessment & Plan: Principal Problem:   Acute dyspnea Active Problems:   Hyponatremia   Aortic valve disorder   Thrombocytopenia (HCC)   Glaucoma   Hyperglycemia   Pneumonia due to COVID-19 virus   Aortic atherosclerosis (HCC)  Acute hypoxemic respiratory failure due to covid-19 pneumonia, suspected superimposed bacterial infection: SARS-CoV-2 PCR positive on 12/10. Symptoms started ~12/7, so worst may be to come especially in light of dramatic elevations in inflammatory markers (CRP 17 > 19) and PCT >2.   - Wean oxygen as tolerated. - Given remdesivir loading dose. Discussed with patient's wife who states she does not want to the patient to receive any further doses due to concern for liver failure and that "hospitals are getting paid to give it." I have discontinued further doses in light of recent evidence showing minimal if any benefit, though ensured that his kidney and liver function are normal and will be monitored regardless. - Repeat CXR after IVF as pneumonia is expected to blossom given hypoxia.  - Continue steroids, augment dose given hypoxia and CRP elevation - Continue empiric abx x5 days, confirm PCT response - Encourage OOB, IS, FV, and awake proning if able -  Continue airborne, contact precautions for 21 days from positive testing. - Monitor CMP and inflammatory markers - Encouraged to get vaccine after convalescence  Supratherapeutic INR: No active bleeding currently.  - Will monitor closely for bleeding which would indicate need for reversal.  - Otherwise, hold coumadin and monitor INR daily.   Mechanical aortic valve:  - Goal INR is 2.5 - 3.5   Thrombocytopenia: Suspected to be related to covid.  - Continue monitoring.   History of CVAs, increased weakness from baseline.  - PT/OT consulted. - Continue ASA 81mg  - With hx CVAs and increased weakness with elevated INR, will check CT head stat. CT head reviewed revealing no acute stroke, will check MRI given infarcts in basal ganglia. His weakness could be recrudescence of remote infarcts.   Dehydration, hypovolemic hyponatremia:  - Give isotonic saline and monitor Na. Avoid overload.  Suspected malnutrition: In setting of malignancy.  - BMI 19, dietitian consulted.   Hyperglycemia:  - Check HbA1c with need for ongoing steroids, give SSI  Glaucoma:  - Continue gtt's  Hypomagnesemia:  - Supplement.  DVT prophylaxis: Supratherapeutic INR Code Status: Full Family Communication: None at bedside, didn't request a call Disposition Plan:  Status is: Observation  The patient will require care spanning > 2 midnights and should be moved to inpatient because: Persistent severe electrolyte disturbances, Unsafe d/c plan and Inpatient level of care appropriate due to severity of illness  Dispo: The patient is from: Home              Anticipated d/c is to: TBD  Anticipated d/c date is: 2 days              Patient currently is not medically stable to d/c.  Consultants:   None  Procedures:   None  Antimicrobials:  Remdesivir  Levaquin   Subjective: Remains moderately weak and short of breath at rest, worse with exertion. No chest pain. Hasn't been eating or drinking  much, but trying to push water. No bleeding whatsoever.  Objective: Vitals:   11/07/20 0817 11/07/20 0830 11/07/20 0900 11/07/20 0930  BP:  (!) 159/52 117/60 131/61  Pulse:  99 (!) 102 98  Resp:  19    Temp:      TempSrc:      SpO2: 97% 95% 96% 95%  Weight:      Height:        Intake/Output Summary (Last 24 hours) at 11/07/2020 0865 Last data filed at 11/07/2020 7846 Gross per 24 hour  Intake 442.81 ml  Output --  Net 442.81 ml   Filed Weights   11/06/20 1956  Weight: 63.5 kg    Gen: 74 y.o. male in no distress, frail-appearing Pulm: Tachypneic with rate in 20's, labored when rising to seated position under his own power for posterior exam, breathing supplemental oxygen, diminished to auscultation bilaterally.  CV: Regular rate and rhythm. No murmur, rub, or gallop. No JVD, no pedal edema. GI: Abdomen soft, non-tender, non-distended, with normoactive bowel sounds. No organomegaly or masses felt. Ext: Warm, no deformities, decreased muscle bulk Skin: No rashes, lesions or ulcers on visualized skin Neuro: Alert and oriented. Diffusely very weak worse in lower extremities with some strength in bilateral legs symmetrically, no sensation deficits. Psych: Judgement and insight appear normal. Mood & affect appropriate.   Data Reviewed: I have personally reviewed following labs and imaging studies  CBC: Recent Labs  Lab 11/06/20 1956 11/06/20 2127 11/07/20 0408  WBC 5.3  --  6.8  NEUTROABS 4.8  --  6.2  HGB 16.0 16.3 14.4  HCT 46.8 48.0 42.1  MCV 89.1  --  88.1  PLT 103*  --  96*   Basic Metabolic Panel: Recent Labs  Lab 11/06/20 1956 11/06/20 2127 11/07/20 0408  NA 129* 130* 128*  K 3.9 3.8 3.9  CL 94* 94* 96*  CO2 23  --  22  GLUCOSE 140* 143* 138*  BUN 19 20 17   CREATININE 0.80 0.70 0.71  CALCIUM 9.2  --  8.3*  MG  --   --  1.5*  PHOS  --   --  2.3*   GFR: Estimated Creatinine Clearance: 73.9 mL/min (by C-G formula based on SCr of 0.71 mg/dL). Liver  Function Tests: Recent Labs  Lab 11/06/20 1956 11/07/20 0408  AST 42* 38  ALT 36 30  ALKPHOS 57 44  BILITOT 1.1 1.1  PROT 7.1 6.2*  ALBUMIN 4.2 3.4*   No results for input(s): LIPASE, AMYLASE in the last 168 hours. No results for input(s): AMMONIA in the last 168 hours. Coagulation Profile: Recent Labs  Lab 11/07/20 0408  INR 5.6*   Cardiac Enzymes: No results for input(s): CKTOTAL, CKMB, CKMBINDEX, TROPONINI in the last 168 hours. BNP (last 3 results) No results for input(s): PROBNP in the last 8760 hours. HbA1C: No results for input(s): HGBA1C in the last 72 hours. CBG: No results for input(s): GLUCAP in the last 168 hours. Lipid Profile: Recent Labs    11/06/20 1956  TRIG 76   Thyroid Function Tests: No results for input(s): TSH, T4TOTAL,  FREET4, T3FREE, THYROIDAB in the last 72 hours. Anemia Panel: Recent Labs    11/06/20 1956 11/07/20 0408  FERRITIN 676* 472*   Urine analysis: No results found for: COLORURINE, APPEARANCEUR, LABSPEC, PHURINE, GLUCOSEU, HGBUR, BILIRUBINUR, KETONESUR, PROTEINUR, UROBILINOGEN, NITRITE, LEUKOCYTESUR Recent Results (from the past 240 hour(s))  Resp Panel by RT-PCR (Flu A&B, Covid) Nasopharyngeal Swab     Status: Abnormal   Collection Time: 11/06/20  9:08 PM   Specimen: Nasopharyngeal Swab; Nasopharyngeal(NP) swabs in vial transport medium  Result Value Ref Range Status   SARS Coronavirus 2 by RT PCR POSITIVE (A) NEGATIVE Final    Comment: SARA DOSTER RN 11/06/2020@2342  BY P.HENDERSON (NOTE) SARS-CoV-2 target nucleic acids are DETECTED.  The SARS-CoV-2 RNA is generally detectable in upper respiratory specimens during the acute phase of infection. Positive results are indicative of the presence of the identified virus, but do not rule out bacterial infection or co-infection with other pathogens not detected by the test. Clinical correlation with patient history and other diagnostic information is necessary to determine  patient infection status. The expected result is Negative.  Fact Sheet for Patients: EntrepreneurPulse.com.au  Fact Sheet for Healthcare Providers: IncredibleEmployment.be  This test is not yet approved or cleared by the [place] FDA and  has been authorized for detection and/or diagnosis of SARS-CoV-2 by FDA under an Emergency Use Authorization (EUA).  This EUA will remain in effect (meaning this test can be used) for the duration of  the COVID- 19 declaration under Section 564(b)(1) of the Act, 21 U.S.C. section 360bbb-3(b)(1), unless the authorization is terminated or revoked sooner.     Influenza A by PCR NEGATIVE NEGATIVE Final   Influenza B by PCR NEGATIVE NEGATIVE Final    Comment: (NOTE) The Xpert Xpress SARS-CoV-2/FLU/RSV plus assay is intended as an aid in the diagnosis of influenza from Nasopharyngeal swab specimens and should not be used as a sole basis for treatment. Nasal washings and aspirates are unacceptable for Xpert Xpress SARS-CoV-2/FLU/RSV testing.  Fact Sheet for Patients: EntrepreneurPulse.com.au  Fact Sheet for Healthcare Providers: IncredibleEmployment.be  This test is not yet approved or cleared by the [place] FDA and has been authorized for detection and/or diagnosis of SARS-CoV-2 by FDA under an Emergency Use Authorization (EUA). This EUA will remain in effect (meaning this test can be used) for the duration of the COVID-19 declaration under Section 564(b)(1) of the Act, 21 U.S.C. section 360bbb-3(b)(1), unless the authorization is terminated or revoked.  Performed at [facility], [place] [address], [place], [place] [address]       Radiology Studies: [facility] Chest Port 1 View  Result Date: 11/06/2020 CLINICAL DATA:  Short of breath EXAM: PORTABLE CHEST 1 VIEW COMPARISON:  None. FINDINGS: Post sternotomy changes. Mild reticular opacity at the left  base, possible mild fibrosis. No acute consolidation or effusion. Normal heart size. Aortic atherosclerosis. No pneumothorax. Clips at the upper mediastinum. IMPRESSION: 1. No focal airspace disease 2. Possible reticular changes/mild fibrosis at the left base Electronically Signed   By: Donavan Foil M.D.   On: 11/06/2020 20:23    Scheduled Meds: . albuterol  2 puff Inhalation Q6H  . vitamin C  500 mg Oral Daily  . aspirin  81 mg Oral Daily  . dexamethasone (DECADRON) injection  6 mg Intravenous Q24H  . dorzolamidel-timolol  1 drop Both Eyes BID  . latanoprost  1 drop Both Eyes QHS  . polyvinyl alcohol  1 drop Both Eyes QHS  . zinc sulfate  220  mg Oral Daily   Continuous Infusions: . levofloxacin (LEVAQUIN) IV Stopped (11/07/20 0618)  . magnesium sulfate bolus IVPB 2 g (11/07/20 0932)  . remdesivir 100 mg in NS 100 mL       LOS: 0 days   Time spent: 35 minutes.  Patrecia Pour, MD [facility] [email or web address] 11/07/2020, 9:39 AM

## 2020-11-07 NOTE — Progress Notes (Signed)
[place] for Warfarin Indication: mechanical AVR  Allergies  Allergen Reactions  . Penicillin G Other (See Comments)    Unknown (childhood)  . Lisinopril Other (See Comments)    Varified By [facility] unknown    Patient Measurements: Height: 5\' 11"  (180.3 cm) Weight: 63.5 kg (140 lb) IBW/kg (Calculated) : 75.3  Vital Signs: Temp: 99.7 F (37.6 C) (12/10 1959) Temp Source: Oral (12/10 1959) BP: 125/61 (12/11 0000) Pulse Rate: 95 (12/11 0000)  Labs: Recent Labs    11/06/20 1956 11/06/20 2127  HGB 16.0 16.3  HCT 46.8 48.0  PLT 103*  --   CREATININE 0.80 0.70    Estimated Creatinine Clearance: 73.9 mL/min (by C-G formula based on SCr of 0.7 mg/dL).   Medical History: Past Medical History:  Diagnosis Date  . 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 Aortic mechanical  . NSCLC of left lung ([place]) 05/26/2020  . Personal history of prostate cancer 05/29/2020  . Stroke ([place]) 04/2020  . Tobacco use disorder 05/26/2020    Medications:  (Not in a hospital admission)   Assessment: Hx mechanical AVR on Warfarin 7.5mg  daily, exc 10mg  Wed/Sat with last dose noted 12/10. Goal INR 2.5-3.5 per previous progress note 05/29/20 [place]  Goal of Therapy:  Goal INR 2.5-3.5 Monitor platelets by anticoagulation protocol: Yes   Plan:  Daily Protime/INR to begin 12/11 am Monitor CBC, for s/s bleeding  Minda Ditto PharmD 11/07/2020,12:14 AM

## 2020-11-07 NOTE — ED Notes (Signed)
Hospitalist, [place], paged for Critical INR 5.6 due to no orders from Dr. Olevia Bowens

## 2020-11-07 NOTE — ED Notes (Signed)
Called wife to let know that CT scan was ordered and he gone now for scan. Reports that the past stokes only affected his legs and didn't show up on CT just on MRI.

## 2020-11-07 NOTE — Plan of Care (Signed)
  Problem: Education: Goal: Knowledge of risk factors and measures for prevention of condition will improve Outcome: Progressing   Problem: Coping: Goal: Psychosocial and spiritual needs will be supported Outcome: Progressing   Problem: Respiratory: Goal: Will maintain a patent airway Outcome: Progressing Goal: Complications related to the disease process, condition or treatment will be avoided or minimized Outcome: Progressing   

## 2020-11-07 NOTE — ED Notes (Addendum)
Wife called and concerned that patient might have had another stroke due to new onset weakness that started yesterday. Hx stroke in July.  Sent message in Amion to Dr Bonner Puna

## 2020-11-07 NOTE — ED Notes (Signed)
Gave pt pitcher ice water

## 2020-11-07 NOTE — ED Notes (Signed)
Date and time results received: 11/07/20 04:33  Test: INR Critical Value: 5.6  Name of Provider Notified: Dr. Olevia Bowens

## 2020-11-08 ENCOUNTER — Inpatient Hospital Stay (HOSPITAL_COMMUNITY): Payer: No Typology Code available for payment source

## 2020-11-08 DIAGNOSIS — J9601 Acute respiratory failure with hypoxia: Secondary | ICD-10-CM

## 2020-11-08 DIAGNOSIS — I639 Cerebral infarction, unspecified: Secondary | ICD-10-CM

## 2020-11-08 DIAGNOSIS — H409 Unspecified glaucoma: Secondary | ICD-10-CM

## 2020-11-08 LAB — CBC WITH DIFFERENTIAL/PLATELET
Abs Immature Granulocytes: 0.04 10*3/uL (ref 0.00–0.07)
Basophils Absolute: 0 10*3/uL (ref 0.0–0.1)
Basophils Relative: 0 %
Eosinophils Absolute: 0 10*3/uL (ref 0.0–0.5)
Eosinophils Relative: 0 %
HCT: 44.6 % (ref 39.0–52.0)
Hemoglobin: 15 g/dL (ref 13.0–17.0)
Immature Granulocytes: 1 %
Lymphocytes Relative: 4 %
Lymphs Abs: 0.3 10*3/uL — ABNORMAL LOW (ref 0.7–4.0)
MCH: 29.8 pg (ref 26.0–34.0)
MCHC: 33.6 g/dL (ref 30.0–36.0)
MCV: 88.5 fL (ref 80.0–100.0)
Monocytes Absolute: 0.3 10*3/uL (ref 0.1–1.0)
Monocytes Relative: 4 %
Neutro Abs: 7.8 10*3/uL — ABNORMAL HIGH (ref 1.7–7.7)
Neutrophils Relative %: 91 %
Platelets: 107 10*3/uL — ABNORMAL LOW (ref 150–400)
RBC: 5.04 MIL/uL (ref 4.22–5.81)
RDW: 14.5 % (ref 11.5–15.5)
WBC: 8.4 10*3/uL (ref 4.0–10.5)
nRBC: 0 % (ref 0.0–0.2)

## 2020-11-08 LAB — COMPREHENSIVE METABOLIC PANEL
ALT: 30 U/L (ref 0–44)
AST: 43 U/L — ABNORMAL HIGH (ref 15–41)
Albumin: 3.1 g/dL — ABNORMAL LOW (ref 3.5–5.0)
Alkaline Phosphatase: 45 U/L (ref 38–126)
Anion gap: 10 (ref 5–15)
BUN: 20 mg/dL (ref 8–23)
CO2: 23 mmol/L (ref 22–32)
Calcium: 8.2 mg/dL — ABNORMAL LOW (ref 8.9–10.3)
Chloride: 95 mmol/L — ABNORMAL LOW (ref 98–111)
Creatinine, Ser: 0.66 mg/dL (ref 0.61–1.24)
GFR, Estimated: >60 mL/min (ref >60)
Glucose, Bld: 126 mg/dL — ABNORMAL HIGH (ref 70–99)
Potassium: 4.4 mmol/L (ref 3.5–5.1)
Sodium: 128 mmol/L — ABNORMAL LOW (ref 135–145)
Total Bilirubin: 0.7 mg/dL (ref 0.3–1.2)
Total Protein: 6 g/dL — ABNORMAL LOW (ref 6.5–8.1)

## 2020-11-08 LAB — HEMOGLOBIN A1C
Hgb A1c MFr Bld: 5.6 % (ref 4.8–5.6)
Mean Plasma Glucose: 114.02 mg/dL

## 2020-11-08 LAB — MAGNESIUM: Magnesium: 2 mg/dL (ref 1.7–2.4)

## 2020-11-08 LAB — PROTIME-INR
INR: 5.7 (ref 0.8–1.2)
Prothrombin Time: 49.5 seconds — ABNORMAL HIGH (ref 11.4–15.2)

## 2020-11-08 LAB — PROCALCITONIN: Procalcitonin: 4.34 ng/mL

## 2020-11-08 LAB — C-REACTIVE PROTEIN: CRP: 21.9 mg/dL — ABNORMAL HIGH (ref <1.0)

## 2020-11-08 NOTE — Consult Note (Signed)
Neurology Consultation Reason for Consult: Generalized weakness Referring Physician: Mathews Robinsons  CC: Generalized weakness  History is obtained from: Patient  HPI: Jamie Young is a 74 y.o. male admitted with COVID-19 he was complaining of generalized weakness and bilateral leg weakness and therefore CT scan was performed which demonstrated significant cerebrovascular disease and therefore an MRI was performed which demonstrates significant white matter disease with small area that could be acute ischemia.  Per the patient, his legs are currently at baseline.  He continues to have cough and has some shortness of breath.   LKW: Unclear tpa given?: no, unclear time of onset    ROS: A 14 point ROS was performed and is negative except as noted in the HPI.   Past Medical History:  Diagnosis Date  . 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 Aortic mechanical  . NSCLC of left lung ([place]) 05/26/2020  . Personal history of prostate cancer 05/29/2020  . Stroke ([place]) 04/2020  . Tobacco use disorder 05/26/2020     Family History  Problem Relation Age of Onset  . Bladder Cancer Father   . Lung cancer Father      Social History:  reports that he has quit smoking. He has never used smokeless tobacco. No history on file for alcohol use and drug use.   Exam: Current vital signs: BP 136/70 (BP Location: Left Arm)   Pulse 80   Temp 98.2 F (36.8 C) (Oral)   Resp 18   Ht 5\' 11"  (1.803 m)   Wt 63.5 kg   SpO2 93%   BMI 19.53 kg/m  Vital signs in last 24 hours: Temp:  [97.7 F (36.5 C)-98.2 F (36.8 C)] 98.2 F (36.8 C) (12/12 2018) Pulse Rate:  [76-80] 80 (12/12 2018) Resp:  [16-24] 18 (12/12 2018) BP: (124-148)/(57-70) 136/70 (12/12 2018) SpO2:  [93 %-96 %] 93 % (12/12 2018)   Physical Exam  Constitutional: Appears well-developed and well-nourished.  Psych: Affect appropriate to situation Eyes: No scleral  injection HENT: No OP obstrucion MSK: no joint deformities.  Cardiovascular: Normal rate and regular rhythm.  Respiratory: Effort normal, non-labored breathing GI: Soft.  No distension. There is no tenderness.  Skin: WDI  Neuro: Mental Status: Patient is awake, alert, oriented to person, place, month, year, and situation. Patient is able to give a clear and coherent history. No signs of aphasia or neglect Cranial Nerves: II: Visual Fields are full. Pupils are equal, round, and reactive to light.   III,IV, VI: EOMI without ptosis or diploplia.  V: Facial sensation is symmetric to temperature VII: Facial movement is symmetric.  VIII: hearing is intact to voice X: Uvula elevates symmetrically XI: Shoulder shrug is symmetric. XII: tongue is midline without atrophy or fasciculations.  Motor: Tone is normal. Bulk is normal. 5/5 strength was present in bilateral arms, he has 4/5 weakness of his left leg, 4+/5 weakness of his right leg Sensory: Sensation is symmetric to light touch and temperature in the arms and legs. Cerebellar: No clear ataxia on finger-nose-finger     I have reviewed labs in epic and the results pertinent to this consultation are: A1c 5.6  I have reviewed the images obtained: MRI brain-punctate area of diffusion signal in the subcortical white matter though I do not see definite correlate on ADC  Impression: 74 year old male with possible acute white matter ischemia, though without correlate on ADC I think that this is markedly unclear.  In any case it is completely incidental and unrelated to his weakness.  I suspect that he may have some recrudescence of his previous stroke symptoms in the setting of his acute infection.  I would use secondary stroke prevention measures including lipid management and continued smoking cessation (he quit in June).  I would not add antiplatelet medications to his Coumadin.  Given the location and appearance, even if this is acute  stroke, it is not embolic in nature and therefore echo/carotids I do not feel are necessary at this time.  Recommendations: 1) no need for antiplatelet medications if he continues to be anticoagulated. 2) lipid panel with statin therapy if LDL greater than 70 3) likely will need PT, OT whether or not this represents acute stroke 4) neurology will be available on an as-needed basis moving forward, please call if further questions or concerns.   Roland Rack, MD [facility] [phone]  If 7pm- 7am, please page neurology on call as listed in [place].

## 2020-11-08 NOTE — Progress Notes (Signed)
OT Cancellation Note  Patient Details Name: Jamie Young MRN: [id number] DOB: 08-21-1946   Cancelled Treatment:    Reason Eval/Treat Not Completed: Patient not medically ready   Noted INR 5.6.  Will recheck on pt next day as per [place], Anthony [phone] Office- [phone], Thereasa Parkin 11/08/2020, 12:40 PM

## 2020-11-08 NOTE — Plan of Care (Signed)
  Problem: Education: Goal: Knowledge of risk factors and measures for prevention of condition will improve Outcome: Progressing   Problem: Respiratory: Goal: Will maintain a patent airway Outcome: Progressing Goal: Complications related to the disease process, condition or treatment will be avoided or minimized Outcome: Progressing   Problem: Clinical Measurements: Goal: Will remain free from infection Outcome: Progressing Goal: Respiratory complications will improve Outcome: Progressing   Problem: Activity: Goal: Risk for activity intolerance will decrease Outcome: Progressing   Problem: Coping: Goal: Psychosocial and spiritual needs will be supported Outcome: Completed/Met   Problem: Education: Goal: Knowledge of General Education information will improve Description: Including pain rating scale, medication(s)/side effects and non-pharmacologic comfort measures Outcome: Completed/Met   Problem: Nutrition: Goal: Adequate nutrition will be maintained Outcome: Completed/Met

## 2020-11-08 NOTE — Progress Notes (Signed)
PT Cancellation Note  Patient Details Name: Jamie Young MRN: [id number] DOB: 11-Mar-1946   Cancelled Treatment:    Reason Eval/Treat Not Completed: Medical issues which prohibited therapy.  INR 5.7, defer mobilizing at this time and will continue efforts to complete PT eval    [facility] 11/08/2020, 12:43 PM

## 2020-11-08 NOTE — Progress Notes (Addendum)
[place] for Warfarin Indication: mechanical AVR  Allergies  Allergen Reactions  . Penicillin G Other (See Comments)    Unknown (childhood)  . Lisinopril Other (See Comments)    Varified By [facility] unknown    Patient Measurements: Height: 5\' 11"  (180.3 cm) Weight: 63.5 kg (140 lb) IBW/kg (Calculated) : 75.3  Vital Signs: Temp: 98 F (36.7 C) (12/12 0603) Temp Source: Oral (12/12 0603) BP: 128/63 (12/12 0603) Pulse Rate: 79 (12/12 0603)  Labs: Recent Labs    11/06/20 1956 11/06/20 2127 11/07/20 0408 11/08/20 0440  HGB 16.0 16.3 14.4 15.0  HCT 46.8 48.0 42.1 44.6  PLT 103*  --  96* 107*  LABPROT  --   --  48.8* 49.5*  INR  --   --  5.6* 5.7*  CREATININE 0.80 0.70 0.71 0.66    Estimated Creatinine Clearance: 73.9 mL/min (by C-G formula based on SCr of 0.66 mg/dL).  Medications:  Medications Prior to Admission  Medication Sig Dispense Refill Last Dose  . Ascorbic Acid (VITAMIN C) 1000 MG tablet Take 1,000 mg by mouth daily.   11/06/2020 at Unknown time  . aspirin 81 MG chewable tablet Chew 81 mg by mouth daily.   11/06/2020 at Unknown time  . Carboxymethylcellulose Sod PF 1 % GEL Place 1 drop into both eyes at bedtime.   11/06/2020 at Unknown time  . Cholecalciferol 50 MCG (2000 UT) TABS Take 2,000 Units by mouth daily.   11/05/2020 at Unknown time  . dorzolamidel-timolol (COSOPT) 22.3-6.8 MG/ML SOLN ophthalmic solution Place 1 drop into both eyes 2 (two) times daily.   11/06/2020 at Unknown time  . latanoprost (XALATAN) 0.005 % ophthalmic solution Place 1 drop into both eyes at bedtime.   11/06/2020 at Unknown time  . warfarin (COUMADIN) 5 MG tablet Take 7.5 mg by mouth See admin instructions. Take 1.5 tablets (7.5 mg ) by mouth once a day except 2 tablets (10 mg) on Wednesday and Saturday   11/06/2020 at 4 pm    Assessment: 89 yoM with PMH mech AVR on warfarin, prostate CA, CVA, COPD, admitted 12/10 for COVID PNA.  Pharmacy to continue warfarin while admitted.   Baseline INR elevated  Prior anticoagulation: warfarin 10mg  Wed/Sat; 7.5 mg all other days; LD 12/10  Significant events:  Today, 11/08/2020:  CBC: Hgb wnl; Plt low but stable (baseline 130-150 during July admission in [place])  INR SUPRAtherapeutic and holding  Major drug interactions: Levofloxacin  No bleeding issues per nursing  PO intake not yet charted  Goal of Therapy: INR 2.5-3.5  Plan:  Hold warfarin today  Do not recommend treatment with vitamin K unless bleeding noted or urgent invasive procedure needed  Daily INR  CBC at least q72 hr while on warfarin  Monitor for signs of bleeding or thrombosis   Reuel Boom, PharmD, BCPS [phone] 11/08/2020, 10:06 AM

## 2020-11-08 NOTE — Progress Notes (Addendum)
PROGRESS NOTE  Jamie Young  AUQ:[id number] DOB: [birth year] DOA: 11/06/2020 PCP: [facility]  Brief Narrative: Jamie Young is a 74 y.o. 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 He had low grade fever, tachycardia, and hypoxia. CRP was 17.4, PCT 2.18, and SARS-CoV-2 PCR confirmed to be positive. CXR revealed fibrotic changes without lobar consolidation. INR noted to be 5.6.  Assessment & Plan: Principal Problem:   Acute dyspnea Active Problems:   Hyponatremia   Aortic valve disorder   Thrombocytopenia (HCC)   Glaucoma   Hyperglycemia   Pneumonia due to COVID-19 virus   Aortic atherosclerosis (HCC)   Acute hypoxemic respiratory failure due to COVID-19 [facility])  Acute hypoxemic respiratory failure due to covid-19 pneumonia, suspected superimposed bacterial infection: SARS-CoV-2 PCR positive on 12/10. Symptoms started ~12/7, so worst may be to come. Inflammatory markers rising, hypoxia remains. CXR repeated this AM reveals increasing fine airspace disease in L > R mid-lower zones on my personal review. - Wean oxygen as tolerated. - Given remdesivir loading dose and none thereafter based on family wishes.  - Continue steroids, augmented dose.  - Continue empiric abx x5 days. PCT has doubled with no fever, leukocytosis, or growth on blood cultures. Will continue close monitoring. - Encourage OOB, IS, FV, and awake proning if able - Continue airborne, contact precautions for 21 days from positive testing. - Monitor CMP and inflammatory markers - Encouraged to get vaccine after convalescence  Supratherapeutic INR: No active bleeding currently.  - Will  monitor closely for bleeding which would indicate need for reversal. Still no bleeding, INR remains elevated. Will avoid reversal and continue monitoring/holding warfarin.   Mechanical aortic valve:  - Goal INR is 2.5 - 3.5   Thrombocytopenia: Suspected to be related to covid.  - Continue monitoring. Slight improvement noted.  History of CVAs, increased weakness from baseline. No new focal deficits on exam. CT head revealed remote basal ganglia infarcts bilaterally (unable to see radiography from previously) and no acute infarcts or hemorrhages. - PT/OT consulted. - Continue ASA 81mg  - MRI ordered for improved sensitivity.  - Discussed statin therapy which family/patient will consider. Will check lipid panel in AM - With no acute infarct noted, no dysrhythmias detected since admission and a desire to facilitate mobility, will DC telemetry.   Dehydration, hypovolemic hyponatremia:  - Stable after isotonic saline and monitor Na. Will continue monitoring without continuing IVF for now to avoid overload.  Suspected malnutrition: In setting of malignancy.  - BMI 19, dietitian consulted.   Hyperglycemia: HbA1c 5.6%.  - Has not required insulin per sliding scale as CBGs have been at goal. We can stop regular CBGs.   Glaucoma:  - Continue gtt's  Hypomagnesemia:  - Supplement.  DVT prophylaxis: Supratherapeutic INR Code Status: Full Family Communication: None at bedside. Spoke with wife by phone yesterday, will touch base again today by phone Disposition Plan:  Status is: Inpatient  The patient will remain inpatient because: Unsafe d/c plan and Inpatient level of care appropriate due to severity of illness. Pt remains newly hypoxic with developing CXR infiltrates, rising inflammatory markers, and Ongoing weakness being worked up with MRI.  Dispo: The patient is from: Home  Anticipated d/c is to: TBD; PT and OT consulted.              Anticipated d/c date is: 2 days               Patient currently is not medically stable to d/c.  Consultants:   None  Procedures:   None  Antimicrobials:  Remdesivir  Levaquin   Subjective: Weakness may come and go in his legs, currently slightly improved. Not eating very much at all because of poor appetite and food getting cold before he gets it. No nausea or vomiting. Shortness of breath is mild, though he hasn't gotten up. No numbness or tingling reported.   Objective: Vitals:   11/07/20 1731 11/07/20 2128 11/08/20 0215 11/08/20 0603  BP: (!) 135/57 (!) 140/57 124/68 128/63  Pulse: 82 78 76 79  Resp: 20 18 18 16   Temp: 98 F (36.7 C) 98 F (36.7 C) 97.7 F (36.5 C) 98 F (36.7 C)  TempSrc: Oral Oral Oral Oral  SpO2: 93% 96% 94% 94%  Weight:      Height:        Intake/Output Summary (Last 24 hours) at 11/08/2020 1103 Last data filed at 11/07/2020 1800 Gross per 24 hour  Intake 566.5 ml  Output --  Net 566.5 ml   Filed Weights   11/06/20 1956  Weight: 63.5 kg   Gen: 74 y.o. male in no distress Pulm: Nonlabored but tachypneic with rate of 22, crackles in bilateral bases. CV: Regular rate and rhythm. No murmur, rub, or gallop. No JVD, no dependent edema. GI: Abdomen soft, non-tender, non-distended, with normoactive bowel sounds.  Ext: Warm, no deformities, decreased muscle bulk Skin: No rashes, lesions or ulcers on visualized skin. Neuro: Alert and oriented. Diffusely decreased strength but still sufficient to resist x4 extremities. Speech wnl, CNs grossly intact. Psych: Judgement and insight appear fair. Mood euthymic & affect congruent. Behavior is appropriate.    Data Reviewed: I have personally reviewed following labs and imaging studies  CBC: Recent Labs  Lab 11/06/20 1956 11/06/20 2127 11/07/20 0408 11/08/20 0440  WBC 5.3  --  6.8 8.4  NEUTROABS 4.8  --  6.2 7.8*  HGB 16.0 16.3 14.4 15.0  HCT 46.8 48.0 42.1 44.6  MCV 89.1  --  88.1 88.5  PLT 103*  --  96* 250*   Basic Metabolic  Panel: Recent Labs  Lab 11/06/20 1956 11/06/20 2127 11/07/20 0408 11/08/20 0440  NA 129* 130* 128* 128*  K 3.9 3.8 3.9 4.4  CL 94* 94* 96* 95*  CO2 23  --  22 23  GLUCOSE 140* 143* 138* 126*  BUN 19 20 17 20   CREATININE 0.80 0.70 0.71 0.66  CALCIUM 9.2  --  8.3* 8.2*  MG  --   --  1.5* 2.0  PHOS  --   --  2.3*  --    GFR: Estimated Creatinine Clearance: 73.9 mL/min (by C-G formula based on SCr of 0.66 mg/dL). Liver Function Tests: Recent Labs  Lab 11/06/20 1956 11/07/20 0408 11/08/20 0440  AST 42* 38 43*  ALT 36 30 30  ALKPHOS 57 44 45  BILITOT 1.1 1.1 0.7  PROT 7.1 6.2* 6.0*  ALBUMIN 4.2 3.4* 3.1*   No results for input(s): LIPASE, AMYLASE in the last 168 hours. No results for input(s): AMMONIA in the last 168 hours. Coagulation Profile: Recent Labs  Lab 11/07/20 0408 11/08/20 0440  INR 5.6* 5.7*   Cardiac Enzymes: No results for  input(s): CKTOTAL, CKMB, CKMBINDEX, TROPONINI in the last 168 hours. BNP (last 3 results) No results for input(s): PROBNP in the last 8760 hours. HbA1C: Recent Labs    11/08/20 0440  HGBA1C 5.6   CBG: No results for input(s): GLUCAP in the last 168 hours. Lipid Profile: Recent Labs    11/06/20 1956  TRIG 76   Thyroid Function Tests: No results for input(s): TSH, T4TOTAL, FREET4, T3FREE, THYROIDAB in the last 72 hours. Anemia Panel: Recent Labs    11/06/20 1956 11/07/20 0408  FERRITIN 676* 472*   Urine analysis: No results found for: COLORURINE, APPEARANCEUR, LABSPEC, PHURINE, GLUCOSEU, HGBUR, BILIRUBINUR, KETONESUR, PROTEINUR, UROBILINOGEN, NITRITE, LEUKOCYTESUR Recent Results (from the past 240 hour(s))  Blood Culture (routine x 2)     Status: None (Preliminary result)   Collection Time: 11/06/20  7:56 PM   Specimen: BLOOD  Result Value Ref Range Status   Specimen Description   Final    BLOOD LEFT WRIST Performed at [facility] [address] [place], [place] [address]    Special Requests    Final    BOTTLES DRAWN AEROBIC AND ANAEROBIC Blood Culture adequate volume Performed at [facility] [address], [place], [place] [address]    Culture   Final    NO GROWTH < 24 HOURS Performed at [facility] [address] [place], [place] [address]    Report Status PENDING  Incomplete  Blood Culture (routine x 2)     Status: None (Preliminary result)   Collection Time: 11/06/20  8:01 PM   Specimen: BLOOD  Result Value Ref Range Status   Specimen Description   Final    BLOOD BLOOD LEFT FOREARM Performed at [facility] [address], [place], [place] [address]    Special Requests   Final    BOTTLES DRAWN AEROBIC AND ANAEROBIC Blood Culture adequate volume Performed at [facility] [address], [place], [place] [address]    Culture   Final    NO GROWTH < 24 HOURS Performed at [facility] [address], [place], [place] [address]    Report Status PENDING  Incomplete  Resp Panel by RT-PCR (Flu A&B, Covid) Nasopharyngeal Swab     Status: Abnormal   Collection Time: 11/06/20  9:08 PM   Specimen: Nasopharyngeal Swab; Nasopharyngeal(NP) swabs in vial transport medium  Result Value Ref Range Status   SARS Coronavirus 2 by RT PCR POSITIVE (A) NEGATIVE Final    Comment: SARA DOSTER RN 11/06/2020@2342  BY P.HENDERSON (NOTE) SARS-CoV-2 target nucleic acids are DETECTED.  The SARS-CoV-2 RNA is generally detectable in upper respiratory specimens during the acute phase of infection. Positive results are indicative of the presence of the identified virus, but do not rule out bacterial infection or co-infection with other pathogens not detected by the test. Clinical correlation with patient history and other diagnostic information is necessary to determine patient infection status. The expected result is Negative.  Fact Sheet for Patients: EntrepreneurPulse.com.au  Fact Sheet for  Healthcare Providers: IncredibleEmployment.be  This test is not yet approved or cleared by the [place] FDA and  has been authorized for detection and/or diagnosis of SARS-CoV-2 by FDA under an Emergency Use Authorization (EUA).  This EUA will remain in effect (meaning this test can be used) for the duration of  the COVID- 19 declaration under Section 564(b)(1) of the Act, 21 U.S.C. section 360bbb-3(b)(1), unless the authorization is terminated or revoked sooner.  Influenza A by PCR NEGATIVE NEGATIVE Final   Influenza B by PCR NEGATIVE NEGATIVE Final    Comment: (NOTE) The Xpert Xpress SARS-CoV-2/FLU/RSV plus assay is intended as an aid in the diagnosis of influenza from Nasopharyngeal swab specimens and should not be used as a sole basis for treatment. Nasal washings and aspirates are unacceptable for Xpert Xpress SARS-CoV-2/FLU/RSV testing.  Fact Sheet for Patients: EntrepreneurPulse.com.au  Fact Sheet for Healthcare Providers: IncredibleEmployment.be  This test is not yet approved or cleared by the [place] FDA and has been authorized for detection and/or diagnosis of SARS-CoV-2 by FDA under an Emergency Use Authorization (EUA). This EUA will remain in effect (meaning this test can be used) for the duration of the COVID-19 declaration under Section 564(b)(1) of the Act, 21 U.S.C. section 360bbb-3(b)(1), unless the authorization is terminated or revoked.  Performed at [facility], [place] [address] [place], [place] [address]       Radiology Studies: CT HEAD WO CONTRAST  Result Date: 11/07/2020 CLINICAL DATA:  Neuro deficit, hemorrhage or stroke suspected EXAM: CT HEAD WITHOUT CONTRAST TECHNIQUE: Contiguous axial images were obtained from the base of the skull through the vertex without intravenous contrast. COMPARISON:  None. FINDINGS: Brain: No evidence of acute infarction, hemorrhage,  hydrocephalus, extra-axial collection or mass lesion/mass effect. Periventricular and deep white matter hypodensity. Multiple small lacunar infarctions of the bilateral basal ganglia. Vascular: No hyperdense vessel or unexpected calcification. Skull: Normal. Negative for fracture or focal lesion. Sinuses/Orbits: No acute finding. Other: None. IMPRESSION: No acute intracranial pathology. Small-vessel white matter disease and multiple small lacunar infarctions of the bilateral basal ganglia. Consider MRI to more sensitively evaluate for acute diffusion restricting infarction if suspected. Electronically Signed   By: Eddie Candle M.D.   On: 11/07/2020 13:35   DG CHEST PORT 1 VIEW  Result Date: 11/08/2020 CLINICAL DATA:  Acute hypoxemic respiratory failure due to covid 19 EXAM: PORTABLE CHEST 1 VIEW COMPARISON:  11/06/2020 FINDINGS: Sternotomy wires overlie normal cardiac silhouette. No effusion, consolidation, or pneumothorax. Fine airspace disease in the LEFT lower lobe. IMPRESSION: Very fine airspace disease in LEFT lower lobe. No significant oval change. Electronically Signed   By: Suzy Bouchard M.D.   On: 11/08/2020 06:12   DG Chest Port 1 View  Result Date: 11/06/2020 CLINICAL DATA:  Short of breath EXAM: PORTABLE CHEST 1 VIEW COMPARISON:  None. FINDINGS: Post sternotomy changes. Mild reticular opacity at the left base, possible mild fibrosis. No acute consolidation or effusion. Normal heart size. Aortic atherosclerosis. No pneumothorax. Clips at the upper mediastinum. IMPRESSION: 1. No focal airspace disease 2. Possible reticular changes/mild fibrosis at the left base Electronically Signed   By: Donavan Foil M.D.   On: 11/06/2020 20:23    Scheduled Meds: . albuterol  2 puff Inhalation Q6H  . vitamin C  500 mg Oral Daily  . aspirin  81 mg Oral Daily  . dorzolamide-timolol  1 drop Both Eyes BID  . latanoprost  1 drop Both Eyes QHS  . methylPREDNISolone (SOLU-MEDROL) injection  60 mg  Intravenous Q12H  . polyvinyl alcohol  1 drop Both Eyes QHS  . Warfarin - Pharmacist Dosing Inpatient   Does not apply q1600  . zinc sulfate  220 mg Oral Daily   Continuous Infusions: . levofloxacin (LEVAQUIN) IV 750 mg (11/07/20 2121)     LOS: 1 day   Time spent: 35 minutes.  Patrecia Pour, MD [facility] [email or web address] 11/08/2020, 11:03 AM

## 2020-11-09 LAB — COMPREHENSIVE METABOLIC PANEL
ALT: 28 U/L (ref 0–44)
AST: 38 U/L (ref 15–41)
Albumin: 3.2 g/dL — ABNORMAL LOW (ref 3.5–5.0)
Alkaline Phosphatase: 45 U/L (ref 38–126)
Anion gap: 11 (ref 5–15)
BUN: 22 mg/dL (ref 8–23)
CO2: 20 mmol/L — ABNORMAL LOW (ref 22–32)
Calcium: 8.4 mg/dL — ABNORMAL LOW (ref 8.9–10.3)
Chloride: 95 mmol/L — ABNORMAL LOW (ref 98–111)
Creatinine, Ser: 0.69 mg/dL (ref 0.61–1.24)
GFR, Estimated: >60 mL/min (ref >60)
Glucose, Bld: 111 mg/dL — ABNORMAL HIGH (ref 70–99)
Potassium: 4.3 mmol/L (ref 3.5–5.1)
Sodium: 126 mmol/L — ABNORMAL LOW (ref 135–145)
Total Bilirubin: 0.8 mg/dL (ref 0.3–1.2)
Total Protein: 5.8 g/dL — ABNORMAL LOW (ref 6.5–8.1)

## 2020-11-09 LAB — CBC WITH DIFFERENTIAL/PLATELET
Abs Immature Granulocytes: 0.07 10*3/uL (ref 0.00–0.07)
Basophils Absolute: 0 10*3/uL (ref 0.0–0.1)
Basophils Relative: 0 %
Eosinophils Absolute: 0 10*3/uL (ref 0.0–0.5)
Eosinophils Relative: 0 %
HCT: 44.3 % (ref 39.0–52.0)
Hemoglobin: 15.4 g/dL (ref 13.0–17.0)
Immature Granulocytes: 0 %
Lymphocytes Relative: 2 %
Lymphs Abs: 0.3 10*3/uL — ABNORMAL LOW (ref 0.7–4.0)
MCH: 30.1 pg (ref 26.0–34.0)
MCHC: 34.8 g/dL (ref 30.0–36.0)
MCV: 86.7 fL (ref 80.0–100.0)
Monocytes Absolute: 0.7 10*3/uL (ref 0.1–1.0)
Monocytes Relative: 4 %
Neutro Abs: 16.5 10*3/uL — ABNORMAL HIGH (ref 1.7–7.7)
Neutrophils Relative %: 94 %
Platelets: 151 10*3/uL (ref 150–400)
RBC: 5.11 MIL/uL (ref 4.22–5.81)
RDW: 14.3 % (ref 11.5–15.5)
WBC: 17.6 10*3/uL — ABNORMAL HIGH (ref 4.0–10.5)
nRBC: 0 % (ref 0.0–0.2)

## 2020-11-09 LAB — PROTIME-INR
INR: 4.6 (ref 0.8–1.2)
Prothrombin Time: 41.8 seconds — ABNORMAL HIGH (ref 11.4–15.2)

## 2020-11-09 LAB — C-REACTIVE PROTEIN: CRP: 11.7 mg/dL — ABNORMAL HIGH (ref <1.0)

## 2020-11-09 LAB — LIPID PANEL
Cholesterol: 116 mg/dL (ref 0–200)
HDL: 34 mg/dL — ABNORMAL LOW (ref >40)
LDL Cholesterol: 62 mg/dL (ref 0–99)
Total CHOL/HDL Ratio: 3.4 RATIO
Triglycerides: 101 mg/dL (ref <150)
VLDL: 20 mg/dL (ref 0–40)

## 2020-11-09 LAB — LEGIONELLA PNEUMOPHILA SEROGP 1 UR AG: L. pneumophila Serogp 1 Ur Ag: NEGATIVE

## 2020-11-09 LAB — PROCALCITONIN: Procalcitonin: 1.98 ng/mL

## 2020-11-09 MED ORDER — METHYLPREDNISOLONE SODIUM SUCC 125 MG IJ SOLR
40.0000 mg | Freq: Every day | INTRAMUSCULAR | Status: DC
  Administered 2020-11-10 – 2020-11-12 (×3): 40 mg via INTRAVENOUS
  Filled 2020-11-09 (×3): qty 2

## 2020-11-09 MED ORDER — ROSUVASTATIN CALCIUM 5 MG PO TABS
5.0000 mg | ORAL_TABLET | Freq: Every day | ORAL | Status: DC
  Administered 2020-11-09 – 2020-11-12 (×4): 5 mg via ORAL
  Filled 2020-11-09 (×4): qty 1

## 2020-11-09 NOTE — Progress Notes (Signed)
CRITICAL VALUE ALERT  Critical Value: INR 4.6  Date & Time Notied:  11/09/20 0528  Provider Notified: APP [place]  Orders Received/Actions taken: None at this time.

## 2020-11-09 NOTE — Plan of Care (Signed)
  Problem: Education: Goal: Knowledge of risk factors and measures for prevention of condition will improve Outcome: Progressing   Problem: Respiratory: Goal: Will maintain a patent airway Outcome: Progressing Goal: Complications related to the disease process, condition or treatment will be avoided or minimized Outcome: Progressing   Problem: Health Behavior/Discharge Planning: Goal: Ability to manage health-related needs will improve Outcome: Progressing   Problem: Clinical Measurements: Goal: Will remain free from infection Outcome: Progressing Goal: Respiratory complications will improve Outcome: Progressing   Problem: Activity: Goal: Risk for activity intolerance will decrease Outcome: Progressing   Problem: Safety: Goal: Ability to remain free from injury will improve Outcome: Progressing

## 2020-11-09 NOTE — Plan of Care (Signed)
  Problem: Respiratory: Goal: Will maintain a patent airway Outcome: Progressing Goal: Complications related to the disease process, condition or treatment will be avoided or minimized Outcome: Progressing   Problem: Clinical Measurements: Goal: Respiratory complications will improve Outcome: Progressing   Problem: Coping: Goal: Level of anxiety will decrease Outcome: Progressing   Problem: Elimination: Goal: Will not experience complications related to urinary retention Outcome: Progressing   Problem: Safety: Goal: Ability to remain free from injury will improve Outcome: Progressing

## 2020-11-09 NOTE — Progress Notes (Signed)
   11/09/20 0449  Vitals  Temp 98.7 F (37.1 C)  Temp Source Oral  BP 135/80  BP Location Left Arm  BP Method Automatic  Patient Position (if appropriate) Lying  Pulse Rate (!) 113  Pulse Rate Source Monitor  Resp (!) 22  MEWS COLOR  MEWS Score Color Yellow  Oxygen Therapy  SpO2 95 %  O2 Device Nasal Cannula  O2 Flow Rate (L/min) 2 L/min  MEWS Score  MEWS Temp 0  MEWS Systolic 0  MEWS Pulse 2  MEWS RR 1  MEWS LOC 0  MEWS Score 3

## 2020-11-09 NOTE — Progress Notes (Signed)
ANTICOAGULATION CONSULT NOTE - Follow Up Consult  Pharmacy Consult for warfarin Indication: mechanical AVR  Allergies  Allergen Reactions  . Penicillin G Other (See Comments)    Unknown (childhood)  . Lisinopril Other (See Comments)    Varified By [facility] unknown    Patient Measurements: Height: 5\' 11"  (180.3 cm) Weight: 63.5 kg (140 lb) IBW/kg (Calculated) : 75.3 Heparin Dosing Weight:   Vital Signs: Temp: 97.7 F (36.5 C) (12/13 1100) Temp Source: Oral (12/13 1100) BP: 128/72 (12/13 1100) Pulse Rate: 72 (12/13 1100)  Labs: Recent Labs    11/07/20 0408 11/08/20 0440 11/09/20 0427  HGB 14.4 15.0 15.4  HCT 42.1 44.6 44.3  PLT 96* 107* 151  LABPROT 48.8* 49.5* 41.8*  INR 5.6* 5.7* 4.6*  CREATININE 0.71 0.66 0.69    Estimated Creatinine Clearance: 73.9 mL/min (by C-G formula based on SCr of 0.69 mg/dL).   Medications:  - Patient reported (on 11/09/20) that his PTA warfarin regimen was 7.5 mg daily except 10 mg on Monday and Friday.  Assessment: Patient is a 74 y.o M with hx CVA and mechanical AVR on warfarin PTA, tested positive for COVID-19 at home presented to the ED on 12/10 with c/o SOB.  Warfarin resumed on admission.  Today, 11/09/2020: - INR remains supra-therapeutic at 4.6 (last dose of warfarin taken on 12/10) - cbc stable. No bleeding documented - Major drug interactions: Levofloxacin   Goal of Therapy:  INR 2.5-3.5 Monitor platelets by anticoagulation protocol: Yes   Plan:  - continue to hold warfarin - daily INR - Monitor for signs of bleeding or thrombosis   Jaymen Fetch P 11/09/2020,11:49 AM

## 2020-11-09 NOTE — Evaluation (Signed)
Physical Therapy Evaluation Patient Details Name: Jamie Young MRN: [id number] DOB: [birth year] Today's Date: 11/09/2020   History of Present Illness  74 y.o. male admitted with COVID-19. He was complaining of generalized weakness and bilateral leg weakness and therefore CT scan was performed which demonstrated significant cerebrovascular disease and therefore an MRI was performed which demonstrates significant white matter disease with small area that could be acute ischemia.  Per neuro note: "Impression: 74 year old male with possible acute white matter ischemia, though without correlate on ADC I think that this is markedly unclear.  In any case it is completely incidental and unrelated to his weakness.  I suspect that he may have some recrudescence of his previous stroke symptoms in the setting of his acute infection."  Clinical Impression  Pt admitted with above diagnosis.  Pt currently with functional limitations due to the deficits listed below (see PT Problem List). Pt will benefit from skilled PT to increase their independence and safety with mobility to allow discharge to the venue listed below.  Pt assisted with standing and transfer to recliner today.  Pt with SPO2 92% on room air at rest and SPO2 92-94% during transfer on room air (RN notified pt on room air - previously on 2L)  Pt reports hx of previous CVAs effecting his strength however feels strength is near baseline, just feeling generally weak and unsteady from being in bed.  Pt states his spouse can assist him at home.  Pt declines SNF, prefers to d/c home and aware of recommendation for assist with mobility at this time due to fall risk.  Also discussed proning for 2 hours as able.  Pt also agreeable to f/u PT upon d/c.     Follow Up Recommendations Home health PT;Supervision for mobility/OOB    Equipment Recommendations  None recommended by PT    Recommendations for Other Services       Precautions / Restrictions  Precautions Precautions: Fall Precaution Comments: elevated INR - Dr. Bonner Puna agreeable to light activity OOB today 12/13      Mobility  Bed Mobility Overal bed mobility: Needs Assistance Bed Mobility: Supine to Sit     Supine to sit: Supervision;HOB elevated          Transfers Overall transfer level: Needs assistance Equipment used: Rolling walker (2 wheeled) Transfers: Sit to/from [facility] Sit to Stand: Min assist Stand pivot transfers: Min guard       General transfer comment: assist to rise and steady, pt with good hand placement, pt able to shift weight and marched in placed for 2 minutes prior to transfer over to recliner  Ambulation/Gait                Stairs            Wheelchair Mobility    Modified Rankin (Stroke Patients Only)       Balance Overall balance assessment: Needs assistance         Standing balance support: Bilateral upper extremity supported Standing balance-Leahy Scale: Poor Standing balance comment: reliant on UE support at this time                             Pertinent Vitals/Pain Pain Assessment: No/denies pain    Home Living Family/patient expects to be discharged to:: Private residence Living Arrangements: Spouse/significant other Available Help at Discharge: Family;Available 24 hours/day Type of Home: House Home Access: Stairs to enter Entrance Stairs-Rails: Right Entrance Stairs-Number  of Steps: 4 Home Layout: One level Home Equipment: [place] - 2 wheels;Cane - single point;Bedside commode;Shower seat      Prior Function Level of Independence: Independent               Hand Dominance        Extremity/Trunk Assessment        Lower Extremity Assessment Lower Extremity Assessment: Generalized weakness (grossly 4-/5 throughout bilaterally, pt reports previous stroke in June with left LE deficits - feels weaker)    Cervical / Trunk Assessment Cervical / Trunk  Assessment: Normal  Communication   Communication: No difficulties  Cognition Arousal/Alertness: Awake/alert Behavior During Therapy: WFL for tasks assessed/performed Overall Cognitive Status: Within Functional Limits for tasks assessed                                        General Comments      Exercises     Assessment/Plan    PT Assessment Patient needs continued PT services  PT Problem List Decreased strength;Decreased mobility;Decreased activity tolerance;Decreased balance;Decreased knowledge of use of DME;Cardiopulmonary status limiting activity       PT Treatment Interventions DME instruction;Therapeutic exercise;Gait training;Balance training;Functional mobility training;Patient/family education;Therapeutic activities    PT Goals (Current goals can be found in the Care Plan section)  Acute Rehab PT Goals PT Goal Formulation: With patient Time For Goal Achievement: 11/23/20 Potential to Achieve Goals: Good    Frequency Min 3X/week   Barriers to discharge        Co-evaluation               AM-PAC PT "6 Clicks" Mobility  Outcome Measure Help needed turning from your back to your side while in a flat bed without using bedrails?: None Help needed moving from lying on your back to sitting on the side of a flat bed without using bedrails?: None Help needed moving to and from a bed to a chair (including a wheelchair)?: A Little Help needed standing up from a chair using your arms (e.g., wheelchair or bedside chair)?: A Little Help needed to walk in hospital room?: A Little Help needed climbing 3-5 steps with a railing? : A Little 6 Click Score: 20    End of Session   Activity Tolerance: Patient tolerated treatment well Patient left: in chair;with call bell/phone within reach;with chair alarm set Nurse Communication: Mobility status (RN aware pt up in chair, on chair alarm, and currently on room air) PT Visit Diagnosis: Difficulty in walking,  not elsewhere classified (R26.2)    Time: 8295-6213 PT Time Calculation (min) (ACUTE ONLY): 25 min   Charges:   PT Evaluation $PT Eval Low Complexity: 1 Low PT Treatments $Therapeutic Activity: 8-22 mins       Jannette Spanner PT, DPT Acute Rehabilitation Services Pager: [phone] Office: [phone]  York Ram E 11/09/2020, 12:17 PM

## 2020-11-09 NOTE — Progress Notes (Addendum)
Initial Nutrition Assessment  DOCUMENTATION CODES:   Non-severe (moderate) malnutrition in context of chronic illness  INTERVENTION:   Ensure Enlive po BID, each supplement provides 350 kcal and 20 grams of protein  Liberalize diet to regular to allow for more choices at meal time.  MVI with minerals    NUTRITION DIAGNOSIS:   Moderate Malnutrition related to chronic illness (COPD, prostate CA, CVA) as evidenced by severe muscle depletion,moderate muscle depletion,moderate fat depletion,mild fat depletion.   GOAL:   Patient will meet greater than or equal to 90% of their needs   MONITOR:   PO intake,Supplement acceptance,Labs,Weight trends  REASON FOR ASSESSMENT:   Consult Assessment of nutrition requirement/status,Poor PO  ASSESSMENT:    Pt is a 73 y.o. male admitted with COVID-19 with complaints of generalized weakness and bilateral leg weakness. 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    Pt reports his appetite has been decreased for about a week since becoming sick. He reports that everything tastes bad, his food is cold by the time it arrives, and he cannot find anything that tastes "normal". He states he is primarily drinking water at this time. Pt states he consumes 2 meals a day at home, which is normal for him. Typical intake: B: bacon, eggs, one Ensure. Unable to get history of other meals. Pt says he snacks throughout the day on "whatever is in the house". Per chart review, pt consumed 0-50% of documented meal records with 31% average meal completion.   Pt stated his UBW is anywhere in between 137-145 lbs. Pt reports he has noticed some weight loss since he has been sick but did not state how much. Per chart review, pt was 62.6 kg in August, indicating no recent weight loss.   Pt stated he experiences weakness in his legs. Pt stated he had two walkers and a cane he uses at home to help ambulate.   Labs  reviewed: A1C 5.6  Medications reviewed and include: Vitamin C, zinc sulfate, Solu-medrol     NUTRITION - FOCUSED PHYSICAL EXAM:  Flowsheet Row Most Recent Value  Upper Arm Region Moderate depletion  Thoracic and Lumbar Region Mild depletion  Buccal Region Moderate depletion  Temple Region Severe depletion  Clavicle Bone Region Severe depletion  Clavicle and Acromion Bone Region Severe depletion  Scapular Bone Region Moderate depletion  Dorsal Hand Severe depletion  Patellar Region Severe depletion  Anterior Thigh Region Moderate depletion  Posterior Calf Region Moderate depletion  Edema (RD Assessment) None  Hair Reviewed  Eyes Reviewed  Mouth Unable to assess  Skin Reviewed  Nails Reviewed       Diet Order:   Diet Order            Diet regular Room service appropriate? Yes; Fluid consistency: Thin  Diet effective now                 EDUCATION NEEDS:   No education needs have been identified at this time  Skin:  Skin Assessment: Reviewed RN Assessment  Last BM:  11/06/20  Height:   Ht Readings from Last 1 Encounters:  11/06/20 5\' 11"  (1.803 m)    Weight:   Wt Readings from Last 1 Encounters:  11/06/20 63.5 kg    Ideal Body Weight:  78.2 kg  BMI:  Body mass index is 19.53 kg/m.  Estimated Nutritional Needs:   Kcal:  1900-2200 kcal  Protein:  95-110 grams  Fluid:  >2.2 L/day    Ronnald Nian, Dietetic Intern Pager: [phone] If unavailable: [phone]

## 2020-11-09 NOTE — Progress Notes (Signed)
PROGRESS NOTE  Jamie Young  GXQ:[id number] DOB: [birth year] DOA: 11/06/2020 PCP: [facility]  Brief Narrative: Jamie Young is a 74 y.o. 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 He had low grade fever, tachycardia, and hypoxia. CRP was 17.4, PCT 2.18, and SARS-CoV-2 PCR confirmed to be positive. CXR revealed fibrotic changes without lobar consolidation. INR noted to be 5.6.  Assessment & Plan: Principal Problem:   Acute dyspnea Active Problems:   Hyponatremia   Aortic valve disorder   Thrombocytopenia (HCC)   Glaucoma   Hyperglycemia   Pneumonia due to COVID-19 virus   Aortic atherosclerosis (HCC)   Acute hypoxemic respiratory failure due to COVID-19 [facility])  Acute hypoxemic respiratory failure due to covid-19 pneumonia, suspected superimposed bacterial infection: SARS-CoV-2 PCR positive on 12/10. Symptoms started ~12/7, so worst may be to come. Inflammatory markers remain very elevated. - Weaned from oxygen. - Given remdesivir loading dose and none thereafter based on family wishes.  - Continue steroids, taper now - Continue empiric abx x5 days. Cultures remain NGTD. PCT coming down - Encourage OOB, IS, FV, and awake proning if able - Continue airborne, contact precautions for 21 days from positive testing. - Monitor CMP and inflammatory markers - Encouraged to get vaccine after convalescence  Supratherapeutic INR: No active bleeding currently. INR improving slowly. - Will monitor closely for bleeding which would indicate need for reversal. Still no bleeding.  Mechanical aortic valve:  - Goal INR is 2.5 - 3.5   Thrombocytopenia: Suspected to  be related to covid.  - Continue monitoring. Slight improvement noted.  Cerebrovascular disease, possible acute ischemic CVA: CT head revealed remote basal ganglia infarcts bilaterally (unable to see radiography from previously) and no acute infarcts or hemorrhages. Subsequent MRI demonstrates widespread severe white matter small vessel disease. Possible acute-subacute corona radiata infarct reviewed by neurology, Dr. Leonel Ramsay.  - PT/OT consulted. - Continue ASA 81mg , anticoagulation chronically. LDL is < 70. Will start very low dose statin.  - No further work up recommended at this time per neurology.  Hyponatremia:  - Will avoid IVF, suspect patient is taking inadequate solute. Liberalize diet, dietitian consulted.  Suspected malnutrition: In setting of malignancy.  - BMI 19, dietitian consulted.   Hyperglycemia: HbA1c 5.6%.  - Has not required insulin per sliding scale as CBGs have been at goal. We can stop regular CBGs.   Glaucoma:  - Continue gtt's  Hypomagnesemia:  - Supplement.  DVT prophylaxis: Supratherapeutic INR Code Status: Full Family Communication: None at bedside. Wife by phone. Disposition Plan:  Status is: Inpatient  The patient will remain inpatient because: Unsafe d/c plan and Inpatient level of care appropriate due to severity of illness. Pt remains newly hypoxic with developing CXR infiltrates, rising inflammatory markers, and Ongoing weakness being worked up with MRI.  Dispo: The patient is from: Home              Anticipated d/c is to: TBD; PT and OT consulted.              Anticipated d/c date is: 1 day              Patient currently is not medically stable to d/c.  Consultants:   None  Procedures:   None  Antimicrobials:  Remdesivir  Levaquin   Subjective: Patient with no new complaints, no dyspnea in fact this is improving. Generalized weakness is worst in legs bilaterally and overall improved from admission.   Objective: Vitals:    11/09/20 0852 11/09/20 1022 11/09/20 1100 11/09/20 1433  BP: 117/65 130/77 128/72 (!) 150/73  Pulse: 85 77 72 87  Resp: 16 20 18 18   Temp: 98.5 F (36.9 C) 97.9 F (36.6 C) 97.7 F (36.5 C) 97.7 F (36.5 C)  TempSrc: Oral Oral Oral Axillary  SpO2: 95% 95% 96% 96%  Weight:      Height:        Intake/Output Summary (Last 24 hours) at 11/09/2020 1601 Last data filed at 11/09/2020 1053 Gross per 24 hour  Intake 600 ml  Output 600 ml  Net 0 ml   Filed Weights   11/06/20 1956  Weight: 63.5 kg   Gen: 74 y.o. male in no distress Pulm: Nonlabored. Clear. CV: Regular rate and rhythm. No murmur, rub, or gallop. No JVD, no dependent edema. GI: Abdomen soft, non-tender, non-distended, with normoactive bowel sounds.  Ext: Warm, no deformities Skin: No rashes, lesions or ulcers on visualized skin. Neuro: Alert and oriented. No focal neurological deficits. Psych: Judgement and insight appear fair. Mood euthymic & affect congruent. Behavior is appropriate.    Data Reviewed: I have personally reviewed following labs and imaging studies  CBC: Recent Labs  Lab 11/06/20 1956 11/06/20 2127 11/07/20 0408 11/08/20 0440 11/09/20 0427  WBC 5.3  --  6.8 8.4 17.6*  NEUTROABS 4.8  --  6.2 7.8* 16.5*  HGB 16.0 16.3 14.4 15.0 15.4  HCT 46.8 48.0 42.1 44.6 44.3  MCV 89.1  --  88.1 88.5 86.7  PLT 103*  --  96* 107* 379   Basic Metabolic Panel: Recent Labs  Lab 11/06/20 1956 11/06/20 2127 11/07/20 0408 11/08/20 0440 11/09/20 0427  NA 129* 130* 128* 128* 126*  K 3.9 3.8 3.9 4.4 4.3  CL 94* 94* 96* 95* 95*  CO2 23  --  22 23 20*  GLUCOSE 140* 143* 138* 126* 111*  BUN 19 20 17 20 22   CREATININE 0.80 0.70 0.71 0.66 0.69  CALCIUM 9.2  --  8.3* 8.2* 8.4*  MG  --   --  1.5* 2.0  --   PHOS  --   --  2.3*  --   --    GFR: Estimated Creatinine Clearance: 73.9 mL/min (by C-G formula based on SCr of 0.69 mg/dL). Liver Function Tests: Recent Labs  Lab 11/06/20 1956 11/07/20 0408  11/08/20 0440 11/09/20 0427  AST 42* 38 43* 38  ALT 36 30 30 28   ALKPHOS 57 44 45 45  BILITOT 1.1 1.1 0.7 0.8  PROT 7.1 6.2* 6.0* 5.8*  ALBUMIN 4.2 3.4* 3.1* 3.2*   No results for input(s): LIPASE, AMYLASE in the last 168 hours. No results for input(s): AMMONIA in the last 168 hours. Coagulation Profile: Recent Labs  Lab 11/07/20 0408 11/08/20 0440 11/09/20 0427  INR 5.6* 5.7* 4.6*   Cardiac Enzymes: No results for input(s): CKTOTAL, CKMB, CKMBINDEX, TROPONINI in the last 168 hours. BNP (last 3 results) No results for input(s): PROBNP in the last 8760 hours. HbA1C: Recent Labs    11/08/20 0440  HGBA1C 5.6   CBG: No results for input(s): GLUCAP in the last 168 hours. Lipid Profile: Recent Labs    11/06/20 1956 11/09/20 0427  CHOL  --  116  HDL  --  34*  LDLCALC  --  62  TRIG 76 101  CHOLHDL  --  3.4   Thyroid Function Tests: No results for input(s): TSH, T4TOTAL, FREET4, T3FREE, THYROIDAB in the last 72 hours. Anemia Panel: Recent Labs    11/06/20 1956 11/07/20 0408  FERRITIN 676* 472*   Urine analysis: No results found for: COLORURINE, APPEARANCEUR, LABSPEC, PHURINE, GLUCOSEU, HGBUR, BILIRUBINUR, KETONESUR, PROTEINUR, UROBILINOGEN, NITRITE, LEUKOCYTESUR Recent Results (from the past 240 hour(s))  Blood Culture (routine x 2)     Status: None (Preliminary result)   Collection Time: 11/06/20  7:56 PM   Specimen: BLOOD  Result Value Ref Range Status   Specimen Description   Final    BLOOD LEFT WRIST Performed at [facility] [address], [place], [place] [address]    Special Requests   Final    BOTTLES DRAWN AEROBIC AND ANAEROBIC Blood Culture adequate volume Performed at [facility] [address] [place], [place] [address]    Culture   Final    NO GROWTH 2 DAYS Performed at [facility] [address], [place], [place] [address]    Report Status PENDING  Incomplete  Blood Culture (routine x 2)      Status: None (Preliminary result)   Collection Time: 11/06/20  8:01 PM   Specimen: BLOOD  Result Value Ref Range Status   Specimen Description   Final    BLOOD BLOOD LEFT FOREARM Performed at [facility] [address] [place], [place] [address]    Special Requests   Final    BOTTLES DRAWN AEROBIC AND ANAEROBIC Blood Culture adequate volume Performed at [facility] [address], [place], [place] [address]    Culture   Final    NO GROWTH 2 DAYS Performed at [facility] [address], [place], [place] [address]    Report Status PENDING  Incomplete  Resp Panel by RT-PCR (Flu A&B, Covid) Nasopharyngeal Swab     Status: Abnormal   Collection Time: 11/06/20  9:08 PM   Specimen: Nasopharyngeal Swab; Nasopharyngeal(NP) swabs in vial transport medium  Result Value Ref Range Status   SARS Coronavirus 2 by RT PCR POSITIVE (A) NEGATIVE Final    Comment: SARA DOSTER RN 11/06/2020@2342  BY P.HENDERSON (NOTE) SARS-CoV-2 target nucleic acids are DETECTED.  The SARS-CoV-2 RNA is generally detectable in upper respiratory specimens during the acute phase of infection. Positive results are indicative of the presence of the identified virus, but do not rule out bacterial infection or co-infection with other pathogens not detected by the test. Clinical correlation with patient history and other diagnostic information is necessary to determine patient infection status. The expected result is Negative.  Fact Sheet for Patients: EntrepreneurPulse.com.au  Fact Sheet for Healthcare Providers: IncredibleEmployment.be  This test is not yet approved or cleared by the [place] FDA and  has been authorized for detection and/or diagnosis of SARS-CoV-2 by FDA under an Emergency Use Authorization (EUA).  This EUA will remain in effect (meaning this test can be used) for the duration of  the COVID- 19 declaration  under Section 564(b)(1) of the Act, 21 U.S.C. section 360bbb-3(b)(1), unless the authorization is terminated or revoked sooner.     Influenza A by PCR NEGATIVE NEGATIVE Final   Influenza B by PCR NEGATIVE NEGATIVE Final    Comment: (NOTE) The Xpert Xpress SARS-CoV-2/FLU/RSV plus assay is intended as an aid in the diagnosis  of influenza from Nasopharyngeal swab specimens and should not be used as a sole basis for treatment. Nasal washings and aspirates are unacceptable for Xpert Xpress SARS-CoV-2/FLU/RSV testing.  Fact Sheet for Patients: EntrepreneurPulse.com.au  Fact Sheet for Healthcare Providers: IncredibleEmployment.be  This test is not yet approved or cleared by the [place] FDA and has been authorized for detection and/or diagnosis of SARS-CoV-2 by FDA under an Emergency Use Authorization (EUA). This EUA will remain in effect (meaning this test can be used) for the duration of the COVID-19 declaration under Section 564(b)(1) of the Act, 21 U.S.C. section 360bbb-3(b)(1), unless the authorization is terminated or revoked.  Performed at  [facility] [address] [place], [place] [address]       Radiology Studies: MR BRAIN WO CONTRAST  Result Date: 11/08/2020 CLINICAL DATA:  74 year old male positive COVID-19. Neurologic deficit. EXAM: MRI HEAD WITHOUT CONTRAST TECHNIQUE: Multiplanar, multiecho pulse sequences of the brain and surrounding structures were obtained without intravenous contrast. COMPARISON:  Head CT yesterday. FINDINGS: Brain: Punctate abnormal trace diffusion in the right corona radiata, series 5, image 85. Subtle if any diffusion restriction on ADC. No other restricted diffusion. However, numerous chronic lacunar infarcts are scattered throughout the bilateral basal ganglia, bilateral thalami, bilateral pons, and the left cerebellar hemisphere. Multiple chronic microhemorrhages in those areas.  Chronic microhemorrhage also at the left frontal operculum. Superimposed Patchy and confluent bilateral additional cerebral white matter T2 and FLAIR hyperintensity, with some areas of cystic white matter encephalomalacia. No cerebral cortex encephalomalacia identified. No midline shift, mass effect, evidence of mass lesion, ventriculomegaly, extra-axial collection or acute intracranial hemorrhage. Cervicomedullary junction and pituitary are within normal limits. Vascular: Major intracranial vascular flow voids are preserved. The right vertebral artery appears dominant. Skull and upper cervical spine: Negative. Visualized bone marrow signal is within normal limits. Sinuses/Orbits: Postoperative changes to both globes. Otherwise negative orbits. Paranasal sinuses and mastoids are stable and well pneumatized. Other: Visible internal auditory structures appear normal. Trace retained secretions in the nasopharynx. Visible scalp and face appear negative. IMPRESSION: 1. Punctate acute to subacute lacunar infarct in the right corona radiata. No associated hemorrhage or mass effect. 2. Underlying very severe chronic small vessel disease, including several chronic micro-hemorrhages. Electronically Signed   By: Genevie Ann M.D.   On: 11/08/2020 15:43   DG CHEST PORT 1 VIEW  Result Date: 11/08/2020 CLINICAL DATA:  Acute hypoxemic respiratory failure due to covid 19 EXAM: PORTABLE CHEST 1 VIEW COMPARISON:  11/06/2020 FINDINGS: Sternotomy wires overlie normal cardiac silhouette. No effusion, consolidation, or pneumothorax. Fine airspace disease in the LEFT lower lobe. IMPRESSION: Very fine airspace disease in LEFT lower lobe. No significant oval change. Electronically Signed   By: Suzy Bouchard M.D.   On: 11/08/2020 06:12    Scheduled Meds: . albuterol  2 puff Inhalation Q6H  . vitamin C  500 mg Oral Daily  . aspirin  81 mg Oral Daily  . dorzolamide-timolol  1 drop Both Eyes BID  . latanoprost  1 drop Both Eyes QHS   . methylPREDNISolone (SOLU-MEDROL) injection  60 mg Intravenous Q12H  . polyvinyl alcohol  1 drop Both Eyes QHS  . Warfarin - Pharmacist Dosing Inpatient   Does not apply q1600  . zinc sulfate  220 mg Oral Daily   Continuous Infusions: . levofloxacin (LEVAQUIN) IV 750 mg (11/08/20 2323)     LOS: 2 days   Time spent: 25 minutes.  Patrecia Pour, MD [facility] [email or web address] 11/09/2020, 4:01 PM

## 2020-11-09 NOTE — Progress Notes (Signed)
   11/09/20 0649  Vitals  Temp (!) 102.4 F (39.1 C)  Temp Source Oral  BP 134/64  MAP (mmHg) 81  BP Location Left Arm  BP Method Automatic  Patient Position (if appropriate) Lying  Pulse Rate (!) 110  Resp (!) 24  MEWS COLOR  MEWS Score Color Red  Oxygen Therapy  SpO2 94 %  O2 Device Nasal Cannula  O2 Flow Rate (L/min) 2 L/min  Pain Assessment  Pain Scale 0-10  Pain Score 0  Complaints & Interventions  Complains of Fever  MEWS Score  MEWS Temp 2  MEWS Systolic 0  MEWS Pulse 1  MEWS RR 1  MEWS LOC 0  MEWS Score 4  Provider Notification  Provider Name/Title [facility], APP  Date Provider Notified 11/09/20  Time Provider Notified [phone]  Notification Type Page  Notification Reason Change in status

## 2020-11-09 NOTE — Evaluation (Signed)
Occupational Therapy Evaluation Patient Details Name: Jamie Young MRN: [id number] DOB: February 18, 1946 Today's Date: 11/09/2020    History of Present Illness 74 y.o. male admitted with COVID-19. He was complaining of generalized weakness and bilateral leg weakness and therefore CT scan was performed which demonstrated significant cerebrovascular disease and therefore an MRI was performed which demonstrates significant white matter disease with small area that could be acute ischemia.  Per neuro note: "Impression: 74 year old male with possible acute white matter ischemia, though without correlate on ADC I think that this is markedly unclear.  In any case it is completely incidental and unrelated to his weakness.  I suspect that he may have some recrudescence of his previous stroke symptoms in the setting of his acute infection."   Clinical Impression   Patient is currently requiring assistance with ADLs including minimal with toileting, min assist with LE dressing, min assist with bathing, and supervision with standing grooming, all of which is below patient's typical baseline of being Modified independent.  During this evaluation, patient was limited by generalized weakness, particularly to LEs with poor dynamic standing balance and unsteadiness when taking steps with RW, as well as decreased activity toelrance, but with vitals stable and SpO2 maintaining at 94% or higher on RW.  Current limitations have the potential to impact patient's safety and independence during functional mobility, as well as performance for ADLs. [facility] "6-clicks" Daily Activity Inpatient Short Form score of 19/24 indicates 42.80% ADL impairment this session. Patient lives with his family, who are able to provide 24/7 supervision and assistance.  Patient demonstrates good rehab potential, and should benefit from continued skilled occupational therapy services while in acute care to maximize safety, independence  and quality of life at home.  Continued occupational therapy services in the home is recommended.  ?   Follow Up Recommendations  Home health OT    Equipment Recommendations    none   Recommendations for Other Services       Precautions / Restrictions Precautions Precautions: Fall Precaution Comments: elevated INR - Dr. Bonner Puna agreeable to light activity OOB today 12/13 Restrictions Weight Bearing Restrictions: No      Mobility Bed Mobility Overal bed mobility: Needs Assistance Bed Mobility: Supine to Sit     Supine to sit: Supervision;HOB elevated     General bed mobility comments: Pt up in recliner for OT Evaluation. Please defer to PT Eval.    Transfers Overall transfer level: Needs assistance Equipment used: Rolling walker (2 wheeled) Transfers: Sit to/from [facility] Sit to Stand: Min assist Stand pivot transfers: Min assist;Min guard       General transfer comment: Verbal cues needed for hand placement when preparing to stand from recliner.    Balance Overall balance assessment: Needs assistance Sitting-balance support: Feet supported Sitting balance-Leahy Scale: Good     Standing balance support: Bilateral upper extremity supported Standing balance-Leahy Scale: Poor Standing balance comment: reliant on UE support at this time, unsteady with dynamic standing activities.                           ADL either performed or assessed with clinical judgement   ADL Overall ADL's : Needs assistance/impaired     Grooming: Min guard;Wash/dry hands Grooming Details (indicate cue type and reason): Standing at sink. Upper Body Bathing: Supervision/ safety;Sitting   Lower Body Bathing: Sit to/from stand;Minimal assistance   Upper Body Dressing : Set up   Lower Body Dressing: Minimal  assistance;Sit to/from stand   Toilet Transfer: Minimal assistance;RW;Cueing for sequencing;Stand-pivot Toilet Transfer Details (indicate cue type  and reason): Recliner<>Standing at Tina and Hygiene: Minimal assistance Toileting - Clothing Manipulation Details (indicate cue type and reason): Currently with Foley catheter.     Functional mobility during ADLs: Minimal assistance;Rolling walker       Vision Baseline Vision/History: Glaucoma Patient Visual Report: No change from baseline Additional Comments: Baseline vision impairment of RT eye due to glaucoma.     Perception     Praxis      Pertinent Vitals/Pain Pain Assessment: No/denies pain     Hand Dominance     Extremity/Trunk Assessment Upper Extremity Assessment Upper Extremity Assessment: Generalized weakness   Lower Extremity Assessment Lower Extremity Assessment: Defer to PT evaluation   Cervical / Trunk Assessment Cervical / Trunk Assessment: Normal   Communication Communication Communication: No difficulties   Cognition Arousal/Alertness: Awake/alert Behavior During Therapy: WFL for tasks assessed/performed Overall Cognitive Status: Within Functional Limits for tasks assessed                                     General Comments  Stood at sink ~2 min for hand hygiene with fair static balance.    Exercises     Shoulder Instructions      Home Living Family/patient expects to be discharged to:: Private residence Living Arrangements: Spouse/significant other Available Help at Discharge: Family;Available 24 hours/day Type of Home: House Home Access: Stairs to enter [employer] of Steps: 4 Entrance Stairs-Rails: Right Home Layout: One level     Bathroom Shower/Tub: Teacher, early years/pre: Standard     Home Equipment: Environmental consultant - 2 wheels;Cane - single point;Bedside commode;Shower seat;Walker - 4 wheels   Additional Comments: Longb ath sponge/brush      Prior Functioning/Environment Level of Independence: Independent                 OT Problem List: Decreased  strength;Decreased activity tolerance;Impaired balance (sitting and/or standing);Decreased knowledge of use of DME or AE      OT Treatment/Interventions: Self-care/ADL training;Therapeutic exercise;Therapeutic activities;Energy conservation;DME and/or AE instruction;Patient/family education;Balance training    OT Goals(Current goals can be found in the care plan section) Acute Rehab OT Goals Patient Stated Goal: Go home and be able to walk without falling. OT Goal Formulation: With patient Time For Goal Achievement: 11/23/20 Potential to Achieve Goals: Good ADL Goals Pt Will Perform Grooming: standing;with modified independence (3/3 tasks) Pt Will Perform Lower Body Bathing: with supervision;sit to/from stand;sitting/lateral leans;with adaptive equipment Pt Will Perform Lower Body Dressing: with adaptive equipment;with supervision (Integrating energy conservation techniques.) Pt Will Transfer to Toilet: with supervision;ambulating;bedside commode Pt Will Perform Toileting - Clothing Manipulation and hygiene: with modified independence;sit to/from stand;sitting/lateral leans Pt/caregiver will Perform Home Exercise Program: Increased strength;Both right and left upper extremity;With theraband;With Supervision (with VSS)  OT Frequency: Min 2X/week   Barriers to D/C:    none known       Co-evaluation              AM-PAC OT "6 Clicks" Daily Activity     Outcome Measure Help from another person eating meals?: None Help from another person taking care of personal grooming?: A Little Help from another person toileting, which includes using toliet, bedpan, or urinal?: A Little Help from another person bathing (including washing, rinsing, drying)?: A Little Help from another  person to put on and taking off regular upper body clothing?: A Little Help from another person to put on and taking off regular lower body clothing?: A Little 6 Click Score: 19   End of Session Equipment Utilized  During Treatment: Rolling walker Nurse Communication: Mobility status (Vitals)  Activity Tolerance: Patient tolerated treatment well Patient left: in chair;with chair alarm set;with call bell/phone within reach  OT Visit Diagnosis: Unsteadiness on feet (R26.81);Dizziness and giddiness (R42);Muscle weakness (generalized) (M62.81)                Time: 2111-7356 OT Time Calculation (min): 18 min Charges:  OT General Charges $OT Visit: 1 Visit OT Evaluation $OT Eval Low Complexity: 1 Low  Ousmane Seeman, OT Acute Rehab Services Office: [phone] 11/09/2020  Julien Girt 11/09/2020, 2:37 PM

## 2020-11-10 DIAGNOSIS — E440 Moderate protein-calorie malnutrition: Secondary | ICD-10-CM | POA: Insufficient documentation

## 2020-11-10 LAB — PROTIME-INR
INR: 6.3 (ref 0.8–1.2)
Prothrombin Time: 53.9 seconds — ABNORMAL HIGH (ref 11.4–15.2)

## 2020-11-10 LAB — COMPREHENSIVE METABOLIC PANEL
ALT: 25 U/L (ref 0–44)
AST: 29 U/L (ref 15–41)
Albumin: 3 g/dL — ABNORMAL LOW (ref 3.5–5.0)
Alkaline Phosphatase: 44 U/L (ref 38–126)
Anion gap: 10 (ref 5–15)
BUN: 19 mg/dL (ref 8–23)
CO2: 20 mmol/L — ABNORMAL LOW (ref 22–32)
Calcium: 8.3 mg/dL — ABNORMAL LOW (ref 8.9–10.3)
Chloride: 94 mmol/L — ABNORMAL LOW (ref 98–111)
Creatinine, Ser: 0.58 mg/dL — ABNORMAL LOW (ref 0.61–1.24)
GFR, Estimated: >60 mL/min (ref >60)
Glucose, Bld: 108 mg/dL — ABNORMAL HIGH (ref 70–99)
Potassium: 4.2 mmol/L (ref 3.5–5.1)
Sodium: 124 mmol/L — ABNORMAL LOW (ref 135–145)
Total Bilirubin: 0.7 mg/dL (ref 0.3–1.2)
Total Protein: 5.7 g/dL — ABNORMAL LOW (ref 6.5–8.1)

## 2020-11-10 LAB — OSMOLALITY, URINE: Osmolality, Ur: 735 mOsm/kg (ref 300–900)

## 2020-11-10 LAB — C-REACTIVE PROTEIN: CRP: 12.9 mg/dL — ABNORMAL HIGH (ref <1.0)

## 2020-11-10 LAB — OSMOLALITY: Osmolality: 264 mOsm/kg — ABNORMAL LOW (ref 275–295)

## 2020-11-10 LAB — SODIUM, URINE, RANDOM: Sodium, Ur: 70 mmol/L

## 2020-11-10 MED ORDER — ENSURE ENLIVE PO LIQD
237.0000 mL | Freq: Three times a day (TID) | ORAL | Status: DC
  Administered 2020-11-10 – 2020-11-12 (×7): 237 mL via ORAL

## 2020-11-10 MED ORDER — LOPERAMIDE HCL 2 MG PO CAPS
2.0000 mg | ORAL_CAPSULE | ORAL | Status: DC | PRN
  Administered 2020-11-10 – 2020-11-12 (×2): 2 mg via ORAL
  Filled 2020-11-10 (×2): qty 1

## 2020-11-10 NOTE — TOC Progression Note (Signed)
Transition of Care [facility]) - Progression Note    Patient Details  Name: Jamie Young MRN: [id number] Date of Birth: Nov 06, 1946  Transition of [facility] CM/SW Contact  Purcell Mouton, RN Phone Number: 11/10/2020, 2:30 PM  Clinical Narrative:     Spoke with pt's wife concerning home health. Mrs. Saddler states that pt had a [place] agency from the [place]. Mrs. Saddler agreed with [facility], as long as there is no bill. A call was made to the [place]. Waiting for a return call.        Expected Discharge Plan and Services                                                 Social Determinants of Health (SDOH) Interventions    Readmission Risk Interventions No flowsheet data found.

## 2020-11-10 NOTE — Progress Notes (Signed)
PROGRESS NOTE  Jamie Young  OHY:[id number] DOB: 1945-12-17 DOA: 11/06/2020 PCP: [facility]  Brief Narrative: Jamie Young is a 74 y.o. 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 He had low grade fever, tachycardia, and hypoxia. CRP was 17.4, PCT 2.18, and SARS-CoV-2 PCR confirmed to be positive. CXR revealed fibrotic changes without lobar consolidation. INR noted to be elevated.  Assessment & Plan: Principal Problem:   Acute dyspnea Active Problems:   Hyponatremia   Aortic valve disorder   Thrombocytopenia (HCC)   Glaucoma   Hyperglycemia   Pneumonia due to COVID-19 virus   Aortic atherosclerosis (HCC)   Acute hypoxemic respiratory failure due to COVID-19 [facility])   Malnutrition of moderate degree  Acute hypoxemic respiratory failure due to covid-19 pneumonia, suspected superimposed bacterial infection: SARS-CoV-2 PCR positive on 12/10. Symptoms started ~12/7, so worst may be to come. Inflammatory markers remain very elevated. - Weaned from oxygen. - Given remdesivir loading dose and none thereafter based on family wishes.  - Taper steroids with improved hypoxia, though inflammatory marker remains elevated. - Complete empiric abx x5 days this PM. Cultures remain NGTD. PCT coming down - Encourage OOB, IS, FV, and awake proning if able - Continue airborne, contact precautions for 21 days from positive testing. - Monitor inflammatory markers - Encouraged to get vaccine after convalescence  Supratherapeutic INR: No active bleeding currently. - INR remains grossly elevated and rising despite not taking coumadin x4 days now. ?if due to inadequate vitamin  K stores and intake. - Will monitor closely for bleeding which would indicate need for reversal. Still no bleeding, so holding vitamin K.   Missed beats: On auscultation/palpation this AM.  - ECG, reapply telemetry monitoring.  - TSH  Mechanical aortic valve:  - Goal INR is 2.5 - 3.5   Thrombocytopenia: Suspected to be related to covid.  - Continue monitoring. Slight improvement noted.  Cerebrovascular disease, possible acute ischemic CVA: CT head revealed remote basal ganglia infarcts bilaterally (unable to see radiography from previously) and no acute infarcts or hemorrhages. Subsequent MRI demonstrates widespread severe white matter small vessel disease. Possible acute-subacute corona radiata infarct reviewed by neurology, Dr. Leonel Ramsay.  - PT/OT consulted > pt declines SNF though is very weak/deconditioned. Note wife also covid positive and having worsening symptoms so his sole caregiver's status is questionable. - Continue ASA 81mg , anticoagulation chronically. LDL is < 70. Started low dose rosuvastatin. - No further work up recommended at this time per neurology.  Hyponatremia, hypovolemic:  - Serum osm low, urine osm and sodium suggest SIADH vs. hormonal etiology. - Check TSH, AM cortisol, consider stim test - Suspect patient is taking inadequate solute as well. Liberalized diet.   Moderate protein calorie malnutrition: In setting of malignancy.  - BMI 19, dietitian consulted.   Hyperglycemia: HbA1c 5.6%.   Glaucoma:  - Continue gtt's  Hypomagnesemia:  - Supplement.  DVT prophylaxis: Supratherapeutic INR Code Status: Full Family Communication: None at bedside. Wife by phone daily. This morning she's waiting on a return call from her doctor because she's continued to have diarrhea and now has nausea and vomiting. She has no hypoxia on pulse ox (retired Therapist, sports) and no dyspnea at all. Disposition Plan:  Status is: Inpatient  The patient will remain inpatient because: Unsafe  d/c plan and Inpatient level of care appropriate due to severity of illness.   Dispo: The patient is from: Home              Anticipated d/c is to: TBD; SNF recommended though declined. Pt likely to return home once INR stabilizes, hyponatremia is worked up and improved, and caregiver support at home is confirmed (wife is only caregiver and has covid with worsening symptoms)              Anticipated d/c date is: 1 day              Patient currently is not medically stable to d/c.  Consultants:   None  Procedures:   None  Antimicrobials:  Remdesivir  Levaquin   Subjective: Remains weak, tired of being in bed. Not eating hardly anything, drinking ensure rarely. Wants to go home. No dyspnea, no fevers.   Objective: Vitals:   11/09/20 1433 11/09/20 1839 11/09/20 2250 11/10/20 0253  BP: (!) 150/73 (!) 141/77 (!) 156/73 (!) 145/97  Pulse: 87 95 96 85  Resp: 18 20 20 20   Temp: 97.7 F (36.5 C) 98.5 F (36.9 C) 98.9 F (37.2 C) 99.5 F (37.5 C)  TempSrc: Axillary Oral  Oral  SpO2: 96% 94% 93% 92%  Weight:      Height:        Intake/Output Summary (Last 24 hours) at 11/10/2020 1205 Last data filed at 11/10/2020 1050 Gross per 24 hour  Intake 830 ml  Output 800 ml  Net 30 ml   Filed Weights   11/06/20 1956  Weight: 63.5 kg   Gen: Frail malnourished male in no distress Pulm: Nonlabored breathing room air. Clear. CV: Frequent missed beats, possibly irregular, rate in 90's. No murmur, rub, or gallop. No JVD, no dependent edema. GI: Abdomen soft, non-tender, non-distended, with normoactive bowel sounds.  Ext: Warm, no deformities, decreased muscle bulk. Skin: No rashes, lesions or ulcers on visualized skin. Neuro: Alert and oriented. No focal neurological deficits. Psych: Judgement and insight appear fair. Mood euthymic & affect congruent. Behavior is appropriate.    Data Reviewed: I have personally reviewed following labs and imaging studies  CBC: Recent Labs  Lab  11/06/20 1956 11/06/20 2127 11/07/20 0408 11/08/20 0440 11/09/20 0427  WBC 5.3  --  6.8 8.4 17.6*  NEUTROABS 4.8  --  6.2 7.8* 16.5*  HGB 16.0 16.3 14.4 15.0 15.4  HCT 46.8 48.0 42.1 44.6 44.3  MCV 89.1  --  88.1 88.5 86.7  PLT 103*  --  96* 107* 161   Basic Metabolic Panel: Recent Labs  Lab 11/06/20 1956 11/06/20 2127 11/07/20 0408 11/08/20 0440 11/09/20 0427 11/10/20 0428  NA 129* 130* 128* 128* 126* 124*  K 3.9 3.8 3.9 4.4 4.3 4.2  CL 94* 94* 96* 95* 95* 94*  CO2 23  --  22 23 20* 20*  GLUCOSE 140* 143* 138* 126* 111* 108*  BUN 19 20 17 20 22 19   CREATININE 0.80 0.70 0.71 0.66 0.69 0.58*  CALCIUM 9.2  --  8.3* 8.2* 8.4* 8.3*  MG  --   --  1.5* 2.0  --   --   PHOS  --   --  2.3*  --   --   --    GFR: Estimated Creatinine Clearance: 73.9 mL/min (A) (by C-G formula based on SCr of 0.58 mg/dL (L)). Liver Function Tests: Recent Labs  Lab 11/06/20 1956 11/07/20 0408 11/08/20 0440 11/09/20 0427 11/10/20 0428  AST 42* 38 43* 38 29  ALT 36 30 30 28 25   ALKPHOS 57 44 45 45 44  BILITOT 1.1 1.1 0.7 0.8 0.7  PROT 7.1 6.2* 6.0* 5.8* 5.7*  ALBUMIN 4.2 3.4* 3.1* 3.2* 3.0*   No results for input(s): LIPASE, AMYLASE in the last 168 hours. No results for input(s): AMMONIA in the last 168 hours. Coagulation Profile: Recent Labs  Lab 11/07/20 0408 11/08/20 0440 11/09/20 0427 11/10/20 0428  INR 5.6* 5.7* 4.6* 6.3*   Cardiac Enzymes: No results for input(s): CKTOTAL, CKMB, CKMBINDEX, TROPONINI in the last 168 hours. BNP (last 3 results) No results for input(s): PROBNP in the last 8760 hours. HbA1C: Recent Labs    11/08/20 0440  HGBA1C 5.6   CBG: No results for input(s): GLUCAP in the last 168 hours. Lipid Profile: Recent Labs    11/09/20 0427  CHOL 116  HDL 34*  LDLCALC 62  TRIG 101  CHOLHDL 3.4   Thyroid Function Tests: No results for input(s): TSH, T4TOTAL, FREET4, T3FREE, THYROIDAB in the last 72 hours. Anemia Panel: No results for input(s):  VITAMINB12, FOLATE, FERRITIN, TIBC, IRON, RETICCTPCT in the last 72 hours. Urine analysis: No results found for: COLORURINE, APPEARANCEUR, LABSPEC, [place], GLUCOSEU, HGBUR, [place], [place], PROTEINUR, UROBILINOGEN, NITRITE, LEUKOCYTESUR Recent Results (from the past 240 hour(s))  Blood Culture (routine x 2)     Status: None (Preliminary result)   Collection Time: 11/06/20  7:56 PM   Specimen: BLOOD  Result Value Ref Range Status   Specimen Description   Final    BLOOD LEFT WRIST Performed at [facility] [address], [place], [place] [address]    Special Requests   Final    BOTTLES DRAWN AEROBIC AND ANAEROBIC Blood Culture adequate volume Performed at [facility] [address], [place], [place] [address]    Culture   Final    NO GROWTH 2 DAYS Performed at [facility] [address], [place], [place] [address]    Report Status PENDING  Incomplete  Blood Culture (routine x 2)     Status: None (Preliminary result)   Collection Time: 11/06/20  8:01 PM   Specimen: BLOOD  Result Value Ref Range Status   Specimen Description   Final    BLOOD BLOOD LEFT FOREARM Performed at [facility] [address] [place], [place] [address]    Special Requests   Final    BOTTLES DRAWN AEROBIC AND ANAEROBIC Blood Culture adequate volume Performed at [facility] [address], [place], [place] [address]    Culture   Final    NO GROWTH 2 DAYS Performed at [facility] [address], [place], [place] [address]    Report Status PENDING  Incomplete  Resp Panel by RT-PCR (Flu A&B, Covid) Nasopharyngeal Swab     Status: Abnormal   Collection Time: 11/06/20  9:08 PM   Specimen: Nasopharyngeal Swab; Nasopharyngeal(NP) swabs in vial transport medium  Result Value Ref Range Status   SARS Coronavirus 2 by RT PCR POSITIVE (A) NEGATIVE Final    Comment: SARA DOSTER RN 11/06/2020@2342  BY  P.HENDERSON (NOTE) SARS-CoV-2 target nucleic acids are DETECTED.  The SARS-CoV-2 RNA is generally detectable in upper respiratory specimens during the acute phase of infection. Positive results are indicative of the presence of the identified virus, but do not rule out bacterial infection or co-infection with other  pathogens not detected by the test. Clinical correlation with patient history and other diagnostic information is necessary to determine patient infection status. The expected result is Negative.  Fact Sheet for Patients: EntrepreneurPulse.com.au  Fact Sheet for Healthcare Providers: IncredibleEmployment.be  This test is not yet approved or cleared by the [place] FDA and  has been authorized for detection and/or diagnosis of SARS-CoV-2 by FDA under an Emergency Use Authorization (EUA).  This EUA will remain in effect (meaning this test can be used) for the duration of  the COVID- 19 declaration under Section 564(b)(1) of the Act, 21 U.S.C. section 360bbb-3(b)(1), unless the authorization is terminated or revoked sooner.     Influenza A by PCR NEGATIVE NEGATIVE Final   Influenza B by PCR NEGATIVE NEGATIVE Final    Comment: (NOTE) The Xpert Xpress SARS-CoV-2/FLU/RSV plus assay is intended as an aid in the diagnosis of influenza from Nasopharyngeal swab specimens and should not be used as a sole basis for treatment. Nasal washings and aspirates are unacceptable for Xpert Xpress SARS-CoV-2/FLU/RSV testing.  Fact Sheet for Patients: EntrepreneurPulse.com.au  Fact Sheet for Healthcare Providers: IncredibleEmployment.be  This test is not yet approved or cleared by the [place] FDA and has been authorized for detection and/or diagnosis of SARS-CoV-2 by FDA under an Emergency Use Authorization (EUA). This EUA will remain in effect (meaning this test can be used) for the duration of  the COVID-19 declaration under Section 564(b)(1) of the Act, 21 U.S.C. section 360bbb-3(b)(1), unless the authorization is terminated or revoked.  Performed at [facility], [place] [address] [place], [place] [address]       Radiology Studies: MR BRAIN WO CONTRAST  Result Date: 11/08/2020 CLINICAL DATA:  74 year old male positive COVID-19. Neurologic deficit. EXAM: MRI HEAD WITHOUT CONTRAST TECHNIQUE: Multiplanar, multiecho pulse sequences of the brain and surrounding structures were obtained without intravenous contrast. COMPARISON:  Head CT yesterday. FINDINGS: Brain: Punctate abnormal trace diffusion in the right corona radiata, series 5, image 85. Subtle if any diffusion restriction on ADC. No other restricted diffusion. However, numerous chronic lacunar infarcts are scattered throughout the bilateral basal ganglia, bilateral thalami, bilateral pons, and the left cerebellar hemisphere. Multiple chronic microhemorrhages in those areas. Chronic microhemorrhage also at the left frontal operculum. Superimposed Patchy and confluent bilateral additional cerebral white matter T2 and FLAIR hyperintensity, with some areas of cystic white matter encephalomalacia. No cerebral cortex encephalomalacia identified. No midline shift, mass effect, evidence of mass lesion, ventriculomegaly, extra-axial collection or acute intracranial hemorrhage. Cervicomedullary junction and pituitary are within normal limits. Vascular: Major intracranial vascular flow voids are preserved. The right vertebral artery appears dominant. Skull and upper cervical spine: Negative. Visualized bone marrow signal is within normal limits. Sinuses/Orbits: Postoperative changes to both globes. Otherwise negative orbits. Paranasal sinuses and mastoids are stable and well pneumatized. Other: Visible internal auditory structures appear normal. Trace retained secretions in the nasopharynx. Visible scalp and face appear negative.  IMPRESSION: 1. Punctate acute to subacute lacunar infarct in the right corona radiata. No associated hemorrhage or mass effect. 2. Underlying very severe chronic small vessel disease, including several chronic micro-hemorrhages. Electronically Signed   By: Genevie Ann M.D.   On: 11/08/2020 15:43    Scheduled Meds: . albuterol  2 puff Inhalation Q6H  . vitamin C  500 mg Oral Daily  . aspirin  81 mg Oral Daily  . dorzolamide-timolol  1 drop Both Eyes BID  . latanoprost  1 drop Both Eyes QHS  . methylPREDNISolone (SOLU-MEDROL)  injection  40 mg Intravenous Daily  . polyvinyl alcohol  1 drop Both Eyes QHS  . rosuvastatin  5 mg Oral Daily  . Warfarin - Pharmacist Dosing Inpatient   Does not apply q1600  . zinc sulfate  220 mg Oral Daily   Continuous Infusions: . levofloxacin (LEVAQUIN) IV 750 mg (11/09/20 2255)     LOS: 3 days   Time spent: 35 minutes.  Patrecia Pour, MD [facility] [email or web address] 11/10/2020, 12:05 PM

## 2020-11-10 NOTE — Progress Notes (Signed)
ANTICOAGULATION CONSULT NOTE - Follow Up Consult  Pharmacy Consult for warfarin Indication: mechanical AVR  Allergies  Allergen Reactions  . Penicillin G Other (See Comments)    Unknown (childhood)  . Lisinopril Other (See Comments)    Varified By [facility] unknown    Patient Measurements: Height: 5\' 11"  (180.3 cm) Weight: 63.5 kg (140 lb) IBW/kg (Calculated) : 75.3 Heparin Dosing Weight:   Vital Signs: Temp: 99.5 F (37.5 C) (12/14 0253) Temp Source: Oral (12/14 0253) BP: 145/97 (12/14 0253) Pulse Rate: 85 (12/14 0253)  Labs: Recent Labs    11/08/20 0440 11/09/20 0427 11/10/20 0428  HGB 15.0 15.4  --   HCT 44.6 44.3  --   PLT 107* 151  --   LABPROT 49.5* 41.8* 53.9*  INR 5.7* 4.6* 6.3*  CREATININE 0.66 0.69 0.58*    Estimated Creatinine Clearance: 73.9 mL/min (A) (by C-G formula based on SCr of 0.58 mg/dL (L)).   Medications:  - Patient reported (on 11/09/20) that his PTA warfarin regimen was 7.5 mg daily except 10 mg on Monday and Friday.  Assessment: Patient is a 74 y.o M with hx CVA and mechanical AVR on warfarin PTA, tested positive for COVID-19 at home presented to the ED on 12/10 with c/o SOB.  Warfarin resumed on admission.  Today, 11/10/2020: - INR remains supra-therapeutic and increased to 6.3 today (last dose of warfarin taken on 12/10). Uncertain as to why INR remains supra-therapeutic and increasing since last dose was taken 4 days ago - LFTs wnl - cbc stable. No bleeding documented - Major drug interactions: Levofloxacin   Goal of Therapy:  INR 2.5-3.5 Monitor platelets by anticoagulation protocol: Yes   Plan:  - continue to hold warfarin - daily INR - Monitor for signs of bleeding or thrombosis   Airianna Kreischer P 11/10/2020,10:31 AM

## 2020-11-10 NOTE — Care Management Important Message (Signed)
Important Message  Patient Details IM Letter given to the Patient. Name: Jamie Young MRN: [id number] Date of Birth: 1946/04/11   Medicare Important Message Given:  Yes     Kerin Salen 11/10/2020, 9:48 AM

## 2020-11-10 NOTE — Plan of Care (Signed)
  Problem: Respiratory: Goal: Will maintain a patent airway Outcome: Progressing Goal: Complications related to the disease process, condition or treatment will be avoided or minimized Outcome: Progressing   Problem: Clinical Measurements: Goal: Ability to maintain clinical measurements within normal limits will improve Outcome: Progressing Goal: Cardiovascular complication will be avoided Outcome: Progressing   Problem: Elimination: Goal: Will not experience complications related to urinary retention Outcome: Progressing   Problem: Pain Managment: Goal: General experience of comfort will improve Outcome: Progressing

## 2020-11-10 NOTE — Progress Notes (Addendum)
CRITICAL VALUE ALERT  Critical Value:  INR 6.3  Date & Time Notied:  11/10/20  0636  Provider Notified: APP Sharlet Salina  Orders Received/Actions taken: APP Sharlet Salina contacted pharmacy to hold coumadin  Continue to monitor for bleeding.

## 2020-11-11 LAB — PROTIME-INR
INR: 5.6 (ref 0.8–1.2)
Prothrombin Time: 49.5 seconds — ABNORMAL HIGH (ref 11.4–15.2)

## 2020-11-11 LAB — BASIC METABOLIC PANEL
Anion gap: 10 (ref 5–15)
BUN: 20 mg/dL (ref 8–23)
CO2: 20 mmol/L — ABNORMAL LOW (ref 22–32)
Calcium: 8.4 mg/dL — ABNORMAL LOW (ref 8.9–10.3)
Chloride: 96 mmol/L — ABNORMAL LOW (ref 98–111)
Creatinine, Ser: 0.58 mg/dL — ABNORMAL LOW (ref 0.61–1.24)
GFR, Estimated: >60 mL/min (ref >60)
Glucose, Bld: 132 mg/dL — ABNORMAL HIGH (ref 70–99)
Potassium: 4.3 mmol/L (ref 3.5–5.1)
Sodium: 126 mmol/L — ABNORMAL LOW (ref 135–145)

## 2020-11-11 LAB — TSH: TSH: 0.538 u[IU]/mL (ref 0.350–4.500)

## 2020-11-11 LAB — CORTISOL-AM, BLOOD: Cortisol - AM: 10.7 ug/dL (ref 6.7–22.6)

## 2020-11-11 LAB — C-REACTIVE PROTEIN: CRP: 10.5 mg/dL — ABNORMAL HIGH (ref <1.0)

## 2020-11-11 NOTE — Progress Notes (Signed)
CRITICAL VALUE ALERT  Critical Value: INR 5.6  Date & Time Notified:  11/11/20 0607  Provider Notified: Arlice Colt  Orders Received/Actions taken: awaiting

## 2020-11-11 NOTE — Plan of Care (Signed)
  Problem: Education: Goal: Knowledge of risk factors and measures for prevention of condition will improve Outcome: Progressing   Problem: Respiratory: Goal: Will maintain a patent airway Outcome: Progressing Goal: Complications related to the disease process, condition or treatment will be avoided or minimized Outcome: Progressing   Problem: Health Behavior/Discharge Planning: Goal: Ability to manage health-related needs will improve Outcome: Progressing   Problem: Clinical Measurements: Goal: Ability to maintain clinical measurements within normal limits will improve Outcome: Progressing Goal: Will remain free from infection Outcome: Progressing Goal: Diagnostic test results will improve Outcome: Progressing Goal: Respiratory complications will improve Outcome: Progressing Goal: Cardiovascular complication will be avoided Outcome: Progressing   Problem: Activity: Goal: Risk for activity intolerance will decrease Outcome: Progressing   Problem: Coping: Goal: Level of anxiety will decrease Outcome: Progressing   Problem: Elimination: Goal: Will not experience complications related to bowel motility Outcome: Progressing Goal: Will not experience complications related to urinary retention Outcome: Progressing   Problem: Pain Managment: Goal: General experience of comfort will improve Outcome: Progressing   Problem: Safety: Goal: Ability to remain free from injury will improve Outcome: Progressing   Problem: Skin Integrity: Goal: Risk for impaired skin integrity will decrease Outcome: Progressing

## 2020-11-11 NOTE — Progress Notes (Signed)
PROGRESS NOTE  Jamie Young  WNU:[id number] DOB: 03/20/1946 DOA: 11/06/2020 PCP: [facility], [facility]  Brief Narrative: Jamie Young is a 74 y.o. 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 He had low grade fever, tachycardia, and hypoxia. CRP was 17.4, PCT 2.18, and SARS-CoV-2 PCR confirmed to be positive. CXR revealed fibrotic changes without lobar consolidation. INR noted to be elevated.  Subjective:  Still reports poor appetite, generalized weakness, denies any shortness of breath, or fever.       Assessment & Plan: Principal Problem:   Acute dyspnea Active Problems:   Hyponatremia   Aortic valve disorder   Thrombocytopenia (HCC)   Glaucoma   Hyperglycemia   Pneumonia due to COVID-19 virus   Aortic atherosclerosis (HCC)   Acute hypoxemic respiratory failure due to COVID-19 [facility])   Malnutrition of moderate degree  Acute hypoxemic respiratory failure due to covid-19 pneumonia, suspected superimposed bacterial infection: SARS-CoV-2 PCR positive on 12/10. Symptoms started ~12/7, so worst may be to come. Inflammatory markers remain very elevated. - Weaned from oxygen.  He is tolerating room air currently - Given remdesivir loading dose and none thereafter based on family wishes.  - Taper steroids with improved hypoxia, though inflammatory marker remains elevated.  Remains on 40 mg of IV Solu-Medrol. - Complete empiric abx x5 days this PM. Cultures remain NGTD. PCT coming down - Encourage OOB, IS, FV, and awake proning if able - Continue airborne, contact precautions for 21 days from positive testing. - Monitor inflammatory markers - Encouraged to get  vaccine after convalescence  Supratherapeutic INR: No active bleeding currently. - INR remains grossly elevated and rising despite not taking coumadin x5 days now. ?if due to inadequate vitamin K stores and intake. - Will monitor closely for bleeding which would indicate need for reversal. Still no bleeding, so holding vitamin K.   Missed beats: On auscultation/palpation this AM.  - ECG, reapply telemetry monitoring.  - TSH  Mechanical aortic valve:  - Goal INR is 2.5 - 3.5   Thrombocytopenia: Suspected to be related to covid.  - Continue monitoring. Slight improvement noted.  Cerebrovascular disease, possible acute ischemic CVA: CT head revealed remote basal ganglia infarcts bilaterally (unable to see radiography from previously) and no acute infarcts or hemorrhages. Subsequent MRI demonstrates widespread severe white matter small vessel disease. Possible acute-subacute corona radiata infarct reviewed by neurology, Dr. Leonel Ramsay.  - PT/OT consulted > pt declines SNF though is very weak/deconditioned. Note wife also covid positive and having worsening symptoms so his sole caregiver's status is questionable. - Continue ASA 81mg , anticoagulation chronically. LDL is < 70. Started low dose rosuvastatin. - No further work up recommended at this time per neurology.  Hyponatremia, hypovolemic:  - Serum osm low, urine osm and sodium suggest SIADH, remains low at 126 today, will add on fluid restrictions . -TSH within normal limit allow -No indication for cortisol or stim test when he is already on steroids and results will be inaccurate.  Moderate protein calorie malnutrition: In setting of malignancy.  - BMI 19, dietitian consulted.   Hyperglycemia: HbA1c 5.6%.   Glaucoma:  - Continue gtt's  Hypomagnesemia:  - Supplement.  DVT prophylaxis: Supratherapeutic INR Code Status: Full Family  Communication: D/W wife by phone Disposition Plan:  Status is: Inpatient  The patient will  remain inpatient because: Unsafe d/c plan and Inpatient level of care appropriate due to severity of illness.   Dispo: The patient is from: Home              Anticipated d/c is to: TBD; SNF if he agrees, otherwise home. symptoms)              Anticipated d/c date is: 1 day              Patient currently is not medically stable to d/c.  Consultants:   None  Procedures:   None  Antimicrobials:  Remdesivir  Levaquin    Objective: Vitals:   11/10/20 0253 11/10/20 1344 11/10/20 2028 11/11/20 0505  BP: (!) 145/97 (!) 108/92 125/66 135/61  Pulse: 85 86 77 87  Resp: 20 20 16 14   Temp: 99.5 F (37.5 C) 97.7 F (36.5 C) 97.7 F (36.5 C) 97.6 F (36.4 C)  TempSrc: Oral Oral Oral Oral  SpO2: 92% 95% 94% 90%  Weight:      Height:        Intake/Output Summary (Last 24 hours) at 11/11/2020 1139 Last data filed at 11/11/2020 0600 Gross per 24 hour  Intake 900 ml  Output 775 ml  Net 125 ml   Filed Weights   11/06/20 1956  Weight: 63.5 kg    Awake Alert, Oriented X 3, extremely frail and deconditioned, no new F.N deficits, Normal affect Symmetrical Chest wall movement, Good air movement bilaterally, CTAB RRR,No Gallops,Rubs, mechanical valve sound, No Parasternal Heave +ve B.Sounds, Abd Soft, No tenderness, No rebound - guarding or rigidity. No Cyanosis, Clubbing or edema, No new Rash or bruise    Data Reviewed: I have personally reviewed following labs and imaging studies  CBC: Recent Labs  Lab 11/06/20 1956 11/06/20 2127 11/07/20 0408 11/08/20 0440 11/09/20 0427  WBC 5.3  --  6.8 8.4 17.6*  NEUTROABS 4.8  --  6.2 7.8* 16.5*  HGB 16.0 16.3 14.4 15.0 15.4  HCT 46.8 48.0 42.1 44.6 44.3  MCV 89.1  --  88.1 88.5 86.7  PLT 103*  --  96* 107* 510   Basic Metabolic Panel: Recent Labs  Lab 11/07/20 0408 11/08/20 0440 11/09/20 0427 11/10/20 0428 11/11/20 0514  NA 128* 128* 126* 124* 126*  K 3.9 4.4 4.3 4.2 4.3  CL 96* 95* 95* 94* 96*  CO2 22 23 20* 20* 20*   GLUCOSE 138* 126* 111* 108* 132*  BUN 17 20 22 19 20   CREATININE 0.71 0.66 0.69 0.58* 0.58*  CALCIUM 8.3* 8.2* 8.4* 8.3* 8.4*  MG 1.5* 2.0  --   --   --   PHOS 2.3*  --   --   --   --    GFR: Estimated Creatinine Clearance: 73.9 mL/min (A) (by C-G formula based on SCr of 0.58 mg/dL (L)). Liver Function Tests: Recent Labs  Lab 11/06/20 1956 11/07/20 0408 11/08/20 0440 11/09/20 0427 11/10/20 0428  AST 42* 38 43* 38 29  ALT 36 30 30 28 25   ALKPHOS 57 44 45 45 44  BILITOT 1.1 1.1 0.7 0.8 0.7  PROT 7.1 6.2* 6.0* 5.8* 5.7*  ALBUMIN 4.2 3.4* 3.1* 3.2* 3.0*   No results for input(s): LIPASE, AMYLASE in the last 168 hours. No results for input(s): AMMONIA in the last 168 hours. Coagulation Profile: Recent Labs  Lab 11/07/20 0408 11/08/20 0440 11/09/20 0427 11/10/20 2585  11/11/20 0514  INR 5.6* 5.7* 4.6* 6.3* 5.6*   Cardiac Enzymes: No results for input(s): CKTOTAL, CKMB, CKMBINDEX, TROPONINI in the last 168 hours. BNP (last 3 results) No results for input(s): PROBNP in the last 8760 hours. HbA1C: No results for input(s): HGBA1C in the last 72 hours. CBG: No results for input(s): GLUCAP in the last 168 hours. Lipid Profile: Recent Labs    11/09/20 0427  CHOL 116  HDL 34*  LDLCALC 62  TRIG 101  CHOLHDL 3.4   Thyroid Function Tests: Recent Labs    11/11/20 0514  TSH 0.538   Anemia Panel: No results for input(s): VITAMINB12, FOLATE, FERRITIN, TIBC, IRON, RETICCTPCT in the last 72 hours. Urine analysis: No results found for: COLORURINE, APPEARANCEUR, LABSPEC, [place], GLUCOSEU, HGBUR, [place], [place], PROTEINUR, UROBILINOGEN, NITRITE, LEUKOCYTESUR Recent Results (from the past 240 hour(s))  Blood Culture (routine x 2)     Status: None (Preliminary result)   Collection Time: 11/06/20  7:56 PM   Specimen: BLOOD  Result Value Ref Range Status   Specimen Description   Final    BLOOD LEFT WRIST Performed at [facility] [address], [place], [place] [address]    Special Requests   Final    BOTTLES DRAWN AEROBIC AND ANAEROBIC Blood Culture adequate volume Performed at [facility] [address] [place], [place] [address]    Culture   Final    NO GROWTH 4 DAYS Performed at [facility], [place] [address], [place], [place] [address]    Report Status PENDING  Incomplete  Blood Culture (routine x 2)     Status: None (Preliminary result)   Collection Time: 11/06/20  8:01 PM   Specimen: BLOOD  Result Value Ref Range Status   Specimen Description   Final    BLOOD BLOOD LEFT FOREARM Performed at [facility] [address] [place], [place] [address]    Special Requests   Final    BOTTLES DRAWN AEROBIC AND ANAEROBIC Blood Culture adequate volume Performed at [facility] [address] [place], [place] [address]    Culture   Final    NO GROWTH 4 DAYS Performed at [facility], [place] [address] [place], [place] [address]    Report Status PENDING  Incomplete  Resp Panel by RT-PCR (Flu A&B, Covid) Nasopharyngeal Swab     Status: Abnormal   Collection Time: 11/06/20  9:08 PM   Specimen: Nasopharyngeal Swab; Nasopharyngeal(NP) swabs in vial transport medium  Result Value Ref Range Status   SARS Coronavirus 2 by RT PCR POSITIVE (A) NEGATIVE Final    Comment: SARA DOSTER RN 11/06/2020@2342  BY P.HENDERSON (NOTE) SARS-CoV-2 target nucleic acids are DETECTED.  The SARS-CoV-2 RNA is generally detectable in upper respiratory specimens during the acute phase of infection. Positive results are indicative of the presence of the identified virus, but do not rule out bacterial infection or co-infection with other pathogens not detected by the test. Clinical correlation with patient history and other diagnostic information is necessary to determine patient infection status. The expected result is Negative.  Fact Sheet for  Patients: EntrepreneurPulse.com.au  Fact Sheet for Healthcare Providers: IncredibleEmployment.be  This test is not yet approved or cleared by the [place] FDA and  has been authorized for detection and/or diagnosis of SARS-CoV-2 by FDA under an Emergency Use Authorization (EUA).  This EUA will remain in effect (meaning this test can be used) for the duration of  the COVID- 19 declaration under Section 564(b)(1) of the Act, 21 U.S.C. section 360bbb-3(b)(1), unless the authorization is terminated or revoked sooner.     Influenza A by PCR NEGATIVE NEGATIVE Final   Influenza B by PCR NEGATIVE NEGATIVE Final    Comment: (NOTE) The Xpert Xpress SARS-CoV-2/FLU/RSV plus assay is intended as an aid in the diagnosis of influenza from Nasopharyngeal swab specimens and should not be used as a sole basis for treatment. Nasal washings and aspirates are unacceptable for Xpert Xpress SARS-CoV-2/FLU/RSV testing.  Fact Sheet for Patients: EntrepreneurPulse.com.au  Fact Sheet for Healthcare Providers: IncredibleEmployment.be  This test is not yet approved or cleared by the [place] FDA and has been authorized for detection and/or diagnosis of SARS-CoV-2 by FDA under an Emergency Use Authorization (EUA). This EUA will remain in effect (meaning this test can be used) for the duration of the COVID-19 declaration under Section 564(b)(1) of the Act, 21 U.S.C. section 360bbb-3(b)(1), unless the authorization is terminated or revoked.  Performed at [facility], [place] [address] [place], [place] [address]       Radiology Studies: No results found.  Scheduled Meds: . albuterol  2 puff Inhalation Q6H  . vitamin C  500 mg Oral Daily  . aspirin  81 mg Oral Daily  . dorzolamide-timolol  1 drop Both Eyes BID  . feeding supplement  237 mL Oral TID BM  . latanoprost  1 drop Both Eyes QHS  .  methylPREDNISolone (SOLU-MEDROL) injection  40 mg Intravenous Daily  . polyvinyl alcohol  1 drop Both Eyes QHS  . rosuvastatin  5 mg Oral Daily  . Warfarin - Pharmacist Dosing Inpatient   Does not apply q1600  . zinc sulfate  220 mg Oral Daily   Continuous Infusions:    LOS: 4 days   Phillips Climes, MD [facility] [email or web address] 11/11/2020, 11:39 AM

## 2020-11-11 NOTE — TOC Progression Note (Signed)
Transition of Care [facility]) - Progression Note    Patient Details  Name: Jamie Young MRN: [id number] Date of Birth: 1946/01/29  Transition [facility] Care [facility]) CM/SW Contact  Purcell Mouton, RN Phone Number: 11/11/2020, 2:09 PM  Clinical Narrative:     A call was made to the [facility] concerning [name] for pt. [facility] use [place], however can not see until 2 weeks related to [place].  Will continue to follow.   Expected Discharge Plan: [place] Barriers to Discharge: No Barriers Identified  Expected Discharge Plan and Services Expected Discharge Plan: [place] arrangements for the past 2 months: Single Family Home                                       Social Determinants of Health (SDOH) Interventions    Readmission Risk Interventions No flowsheet data found.

## 2020-11-11 NOTE — Progress Notes (Signed)
ANTICOAGULATION CONSULT NOTE - Follow Up Consult  Pharmacy Consult for warfarin Indication: mechanical AVR  Allergies  Allergen Reactions  . Penicillin G Other (See Comments)    Unknown (childhood)  . Lisinopril Other (See Comments)    Varified By [facility] unknown    Patient Measurements: Height: 5\' 11"  (180.3 cm) Weight: 63.5 kg (140 lb) IBW/kg (Calculated) : 75.3 Heparin Dosing Weight:   Vital Signs: Temp: 97.6 F (36.4 C) (12/15 0505) Temp Source: Oral (12/15 0505) BP: 135/61 (12/15 0505) Pulse Rate: 87 (12/15 0505)  Labs: Recent Labs    11/09/20 0427 11/10/20 0428 11/11/20 0514  HGB 15.4  --   --   HCT 44.3  --   --   PLT 151  --   --   LABPROT 41.8* 53.9* 49.5*  INR 4.6* 6.3* 5.6*  CREATININE 0.69 0.58* 0.58*    Estimated Creatinine Clearance: 73.9 mL/min (A) (by C-G formula based on SCr of 0.58 mg/dL (L)).   Medications:  - Patient reported (on 11/09/20) that his PTA warfarin regimen was 7.5 mg daily except 10 mg on Monday and Friday.  Assessment: Patient is a 74 y.o M with hx CVA and mechanical AVR on warfarin PTA, tested positive for COVID-19 at home presented to the ED on 12/10 with c/o SOB.  Warfarin resumed on admission.  Today, 11/11/2020: - INR remains supra-therapeutic but decreased down to 5.6 (last dose of warfarin taken on 12/10). - LFTs wnl - last cbc on 12/13 was stable. No bleeding documented -  Levofloxacin d/ced om 12/14   Goal of Therapy:  INR 2.5-3.5 Monitor platelets by anticoagulation protocol: Yes   Plan:  - continue to hold warfarin - daily INR - Monitor for s/sx of bleeding   Tedra Coppernoll P 11/11/2020,10:38 AM

## 2020-11-11 NOTE — Plan of Care (Signed)
  Problem: Clinical Measurements: Goal: Diagnostic test results will improve Outcome: Progressing   Problem: Education: Goal: Knowledge of risk factors and measures for prevention of condition will improve Outcome: Adequate for Discharge   Problem: Respiratory: Goal: Will maintain a patent airway Outcome: Adequate for Discharge   Problem: Health Behavior/Discharge Planning: Goal: Ability to manage health-related needs will improve Outcome: Adequate for Discharge   Problem: Clinical Measurements: Goal: Respiratory complications will improve Outcome: Adequate for Discharge   Problem: Elimination: Goal: Will not experience complications related to bowel motility Outcome: Adequate for Discharge

## 2020-11-12 LAB — COMPREHENSIVE METABOLIC PANEL
ALT: 40 U/L (ref 0–44)
AST: 36 U/L (ref 15–41)
Albumin: 3 g/dL — ABNORMAL LOW (ref 3.5–5.0)
Alkaline Phosphatase: 50 U/L (ref 38–126)
Anion gap: 9 (ref 5–15)
BUN: 22 mg/dL (ref 8–23)
CO2: 23 mmol/L (ref 22–32)
Calcium: 8.3 mg/dL — ABNORMAL LOW (ref 8.9–10.3)
Chloride: 94 mmol/L — ABNORMAL LOW (ref 98–111)
Creatinine, Ser: 0.55 mg/dL — ABNORMAL LOW (ref 0.61–1.24)
GFR, Estimated: >60 mL/min (ref >60)
Glucose, Bld: 133 mg/dL — ABNORMAL HIGH (ref 70–99)
Potassium: 4.1 mmol/L (ref 3.5–5.1)
Sodium: 126 mmol/L — ABNORMAL LOW (ref 135–145)
Total Bilirubin: 0.9 mg/dL (ref 0.3–1.2)
Total Protein: 5.7 g/dL — ABNORMAL LOW (ref 6.5–8.1)

## 2020-11-12 LAB — CBC
HCT: 38.1 % — ABNORMAL LOW (ref 39.0–52.0)
Hemoglobin: 13.4 g/dL (ref 13.0–17.0)
MCH: 29.9 pg (ref 26.0–34.0)
MCHC: 35.2 g/dL (ref 30.0–36.0)
MCV: 85 fL (ref 80.0–100.0)
Platelets: 205 10*3/uL (ref 150–400)
RBC: 4.48 MIL/uL (ref 4.22–5.81)
RDW: 14.2 % (ref 11.5–15.5)
WBC: 12.3 10*3/uL — ABNORMAL HIGH (ref 4.0–10.5)
nRBC: 0 % (ref 0.0–0.2)

## 2020-11-12 LAB — MAGNESIUM: Magnesium: 2.2 mg/dL (ref 1.7–2.4)

## 2020-11-12 LAB — PROTIME-INR
INR: 4.6 (ref 0.8–1.2)
Prothrombin Time: 42.5 seconds — ABNORMAL HIGH (ref 11.4–15.2)

## 2020-11-12 LAB — PHOSPHORUS: Phosphorus: 2.6 mg/dL (ref 2.5–4.6)

## 2020-11-12 MED ORDER — SODIUM CHLORIDE 1 G PO TABS
1.0000 g | ORAL_TABLET | Freq: Two times a day (BID) | ORAL | Status: DC
  Administered 2020-11-12: 09:00:00 1 g via ORAL
  Filled 2020-11-12: qty 1

## 2020-11-12 MED ORDER — WARFARIN SODIUM 5 MG PO TABS: ORAL_TABLET | ORAL | Status: AC

## 2020-11-12 MED ORDER — PANTOPRAZOLE SODIUM 40 MG PO TBEC: 40.0000 mg | DELAYED_RELEASE_TABLET | Freq: Every day | ORAL | 0 refills | Status: AC

## 2020-11-12 MED ORDER — DEXAMETHASONE 6 MG PO TABS: 6.0000 mg | ORAL_TABLET | Freq: Every day | ORAL | 0 refills | Status: AC

## 2020-11-12 MED ORDER — ENSURE ENLIVE PO LIQD: 237.0000 mL | Freq: Three times a day (TID) | ORAL | 12 refills | Status: AC

## 2020-11-12 MED ORDER — LIP MEDEX EX OINT
TOPICAL_OINTMENT | CUTANEOUS | Status: DC | PRN
  Administered 2020-11-12: 1 via TOPICAL
  Filled 2020-11-12: qty 7

## 2020-11-12 MED ORDER — ROSUVASTATIN CALCIUM 5 MG PO TABS: 5.0000 mg | ORAL_TABLET | Freq: Every day | ORAL | 0 refills | Status: AC

## 2020-11-12 MED ORDER — FUROSEMIDE 10 MG/ML IJ SOLN
20.0000 mg | Freq: Once | INTRAMUSCULAR | Status: AC
  Administered 2020-11-12: 13:00:00 20 mg via INTRAVENOUS
  Filled 2020-11-12: qty 2

## 2020-11-12 NOTE — Progress Notes (Addendum)
ANTICOAGULATION CONSULT NOTE - Follow Up Consult  Pharmacy Consult for warfarin Indication: mechanical AVR  Allergies  Allergen Reactions  . Penicillin G Other (See Comments)    Unknown (childhood)  . Lisinopril Other (See Comments)    Varified By [facility] unknown    Patient Measurements: Height: 5\' 11"  (180.3 cm) Weight: 63.5 kg (140 lb) IBW/kg (Calculated) : 75.3 Heparin Dosing Weight:   Vital Signs: Temp: 98.2 F (36.8 C) (12/16 0550) Temp Source: Oral (12/16 0550) BP: 126/66 (12/16 0550) Pulse Rate: 102 (12/16 0550)  Labs: Recent Labs    11/10/20 0428 11/11/20 0514 11/12/20 0454  HGB  --   --  13.4  HCT  --   --  38.1*  PLT  --   --  205  LABPROT 53.9* 49.5* 42.5*  INR 6.3* 5.6* 4.6*  CREATININE 0.58* 0.58* 0.55*    Estimated Creatinine Clearance: 73.9 mL/min (A) (by C-G formula based on SCr of 0.55 mg/dL (L)).   Medications:  - Patient reported (on 11/09/20) that his PTA warfarin regimen was 7.5 mg daily except 10 mg on Monday and Friday.  Assessment: Patient is a 74 y.o M with hx CVA and mechanical AVR on warfarin PTA, tested positive for COVID-19 at home presented to the ED on 12/10 with c/o SOB.  Warfarin resumed on admission.  Today, 11/12/2020: - INR remains supra-therapeutic but decreased down to 4.6 (last dose of warfarin taken on 12/10). - LFTs wnl - CBC: Hgb 13.4, Plt increased/wnl.  No bleeding documented - Drug-drug interactions: Levofloxacin x5 days d/ced 12/14  Goal of Therapy:  INR 2.5-3.5 Monitor platelets by anticoagulation protocol: Yes   Plan:  - continue to hold warfarin - daily INR - Monitor for s/sx of bleeding   Gretta Arab PharmD, BCPS Clinical Pharmacist [facility] main pharmacy [phone] 11/12/2020 9:47 AM   Addendum: Discharge planning requested by Dr. Urban Gibson.  If INR continues to decrease tomorrow, recommend to resume lower dose warfarin 5mg  daily x 3 days and re-check INR no later than 3 days on Monday,  12/20.  If possible, would check INR sooner given changes in recent drug regimen.  Gretta Arab PharmD, BCPS Clinical Pharmacist [facility] main pharmacy [phone] 11/12/2020 12:06 PM

## 2020-11-12 NOTE — TOC Progression Note (Signed)
Transition of Care [facility]) - Progression Note    Patient Details  Name: Jamie Young MRN: [id number] Date of Birth: [birth year]  Transition of Care [facility]) CM/SW Contact  Purcell Mouton, RN Phone Number: 11/12/2020, 12:44 PM  Clinical Narrative:    Spoke with pt's wife Mrs. Jamie Young concerning discharge plan and needs. Explained that [facility] will not change for visits. Jamie Young states that they will stay with the [place]. Jamie Young continued with that she will call the [place] to setup home health and to get someone to come from the [place] to draw pt's blood. Offered Home Health and [facility] to Jamie Young. Explained that [facility] do not change for services, but will change insurance for labs and any x-rays.[facility] was canceled, the [place] was called. Jamie Young, [place], Provider Landry Mellow [phone].   Expected Discharge Plan: [place] Barriers to Discharge: No Barriers Identified  Expected Discharge Plan and Services Expected Discharge Plan: [place] arrangements for the past 2 months: Single Family Home                                       Social Determinants of Health (SDOH) Interventions    Readmission Risk Interventions No flowsheet data found.

## 2020-11-12 NOTE — Care Plan (Signed)
Written details received regarding transportation provided by the [place] received: to call [phone] ext [phone]. Left message at this time.

## 2020-11-12 NOTE — Plan of Care (Signed)
[facility] with [facility] regarding [facility] for [facility].  Called and updated wife; Ms. Tera Young has decided to come get him instead as they are both anxious for Jamie Young to get home.  Preparing patient for discharge at this time.

## 2020-11-12 NOTE — Progress Notes (Signed)
CRITICAL VALUE ALERT  Critical Value:  INR 4.6  Date & Time Notied:  11/12/20 0550  Provider Notified: Arlice Colt  Orders Received/Actions taken: none at this time

## 2020-11-12 NOTE — TOC Progression Note (Addendum)
Transition of Care [facility]) - Progression Note    Patient Details  Name: Jamie Young MRN: [id number] Date of Birth: 10-14-1946  Transition of Care [facility]) CM/SW Contact  Purcell Mouton, RN Phone Number: 11/12/2020, 1:28 PM  Clinical Narrative:     Spoke with [employer], CSW. [facility] could not service pt at home. [facility] was selected and will service pt at home. Jasmine asked that discharge summary and home health orders be sent to Dr. Ronnald Ramp at the [facility] [phone]. Pt's wife was called and made aware that [facility] would service pt at home. Wife asked that [phone] be called for transportation. Information given to pt's RN.   Expected Discharge Plan: [place] Barriers to Discharge: No Barriers Identified  Expected Discharge Plan and Services Expected Discharge Plan: [place] arrangements for the past 2 months: Single Family Home Expected Discharge Date: 11/12/20                                     Social Determinants of Health (SDOH) Interventions    Readmission Risk Interventions No flowsheet data found.

## 2020-11-12 NOTE — Plan of Care (Signed)
Patient discharged home.  Discharge instructions and medications reviewed with Mr. Jamie Young at bedside.  Discharge instructions reviewed again with wife of patient after escorting patient to the car.  Written prescriptions given to patient (in envelope with discharge packet).  Patient left with all belongings.  IV removed x2, cath intact.  VSS.  Patient and wife did not have additional concerns or questions at this time.

## 2020-11-12 NOTE — Discharge Summary (Signed)
Jamie Young, is a 74 y.o. male  DOB February 15, 1946  MRN [id number].  Admission date:  11/06/2020  Admitting Physician  Patrecia Pour, MD  Discharge Date:  11/12/2020   Primary MD  Cl[facility], [facility]  Recommendations for primary care physician for things to follow:  -Check CBC, CMP during next visit. -Please follow INR and adjust warfarin dose closely   - Patient is adamant about going home today, despite multiple discussion by me try to convince him to go to rehab facility, or to wait another 24 hours, but he is insisting on going home today. -He is at high risk for readmission as he is refusing subacute rehab.   Admission Diagnosis  Acute dyspnea [R06.00] Acute hypoxemic respiratory failure due to COVID-19 ([place]) [U07.1, J96.01] Pneumonia due to COVID-19 virus [U07.1, J12.82] COVID-19 [U07.1]   Discharge Diagnosis  Acute dyspnea [R06.00] Acute hypoxemic respiratory failure due to COVID-19 ([place]) [U07.1, J96.01] Pneumonia due to COVID-19 virus [U07.1, J12.82] COVID-19 [U07.1]    Principal Problem:   Acute dyspnea Active Problems:   Hyponatremia   Aortic valve disorder   Thrombocytopenia (HCC)   Glaucoma   Hyperglycemia   Pneumonia due to COVID-19 virus   Aortic atherosclerosis (HCC)   Acute hypoxemic respiratory failure due to COVID-19 ([place])   Malnutrition of moderate degree      Past Medical History:  Diagnosis Date  . 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 Aortic mechanical  . NSCLC of left lung ([place]) 05/26/2020  . Personal history of prostate cancer 05/29/2020  . Stroke ([place]) 04/2020  . Tobacco use disorder 05/26/2020    Past Surgical History:  Procedure Laterality Date  . AORTIC VALVE REPAIR         History of present illness and  Hospital Course:     Kindly see H&P for history of present illness and  admission details, please review complete Labs, Consult reports and Test reports for all details in brief  HPI  from the history and physical done on the day of admission 11/06/2020  HPI: Jamie Young is a 74 y.o. male with medical history significant of artificial heart valve, prostate cancer, history of other nonhemorrhagic stroke, COPD, former tobacco use who is coming to the emergency department due to progressively worse dyspnea and fever since yesterday.  Earlier this week, the patient had a 4-5 episodes of diarrhea.  He denies abdominal pain, emesis, melena or hematochezia.  No dysuria, frequency or hematuria. No chest pain, palpitations, diaphoresis, PND, orthopnea or pitting edema of the lower extremities.   He mentions that his wife came from [facility] to stay with him had, but developed URI symptoms of and subsequently tested positive for COVID-19.  He tested positive for Covid on the home kit.    ED Course: Initial vital signs were 99.7 F, pulse 107, respiration 15, BP 152/65 mmHg and O2 sat 94% on nasal cannula oxygen.  The patient received dexamethasone 4 mg IVP  in the emergency department.  Lab work: CBC showed white count 5.3, ammonia 16.0 g/dL platelets 103.  D-dimer was 0.33 and fibrinogen 615.  Ferritin was 676 and procalcitonin 2.18 ng/mL.  CRP was 17.4 mg/dL.  Lactic acid was normal.  LDH 313.  Sodium 129 and chloride 94 mmol/L.  Glucose 140 mg/dL and AST 42 units/L.  The rest of the CMP values are within expected range.  Coronavirus 2 PCR was positive.  Imaging: A portable 1 view chest radiograph show reticular changes mild fibrosis in the left base, but there was no focal airspace disease.  Please see image and full detailed report for further detail.  Hospital Course     Acute hypoxemic respiratory failure due to covid-19 pneumonia, suspected superimposed bacterial infection: SARS-CoV-2 PCR positive on 12/10. Symptoms started ~12/7, he did require some oxygen, he did  receive loading dose of remdesivir, but then it has been discontinued given family/patient wishes, he was treated with Solu-Medrol, oxygen requirement has improved, he is on room air over last 24 hours, as well he was treated with antibiotics given procalcitonin on admission, it is trending down, cultures remains negative to date, hypoxia has resolved, but patient is extremely frail and deconditioned, with recommendation has been made for subacute rehab facility, but patient has adamantly declined that, as well this morning he was persisting on going home, so he will be discharged on p.o. Decadron for another 5 days for total of 10 days treatment.  Supratherapeutic INR: -  No active bleeding currently.  It is currently trending down, it is 4.6, wife reported his dose has been recently adjusted by his clinic, so recommendation is to hold warfarin this evening, and to continue with warfarin morrow at 5 mg for next 3 days, by then he should have had his INR checked by his [facility] clinic and further recommendation will be based upon his INR results.   Mechanical aortic valve:  - Goal INR is 2.5 - 3.5   Thrombocytopenia: Suspected to be related to covid.  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 Subsequent MRI demonstrates widespread severe white matter small vessel disease. Possible acute-subacute corona radiata infarct reviewed by neurology, Dr. Leonel Ramsay.  - Continue ASA 10m, anticoagulation chronically. LDL is < 70. Started low dose rosuvastatin. - No further work up recommended at this time per neurology.  Hyponatremia. -The setting of SIADH, sodium stable at 126, he was given low-dose Lasix and salt tablets at day of discharge, please repeat CMP as an outpatient. -TSH within normal limit, cortisol a.m. within normal  limit.  Moderate protein calorie malnutrition: In setting of malignancy.  - BMI 19, dietitian consulted.   Hyperglycemia: HbA1c 5.6%.   Glaucoma:  - Continue home medications  Hypomagnesemia:  - Supplemented   Discharge Condition:  -Is at high risk for readmission, he is adamant about going home today.    Discharge Instructions  and  Discharge Medications     Discharge Instructions    Discharge instructions   Complete by: As directed    Follow with Primary MD [facility]  Get CBC, CMP, INR checked  by Primary MD next visit.    Activity: As tolerated with Full fall precautions use walker/cane & assistance as needed   Disposition Home    Diet: Heart Healthy , with feeding assistance and aspiration precautions.  For Heart failure  patients - Check your Weight same time everyday, if you gain over 2 pounds, or you develop in leg swelling, experience more shortness of breath or chest pain, call your Primary MD immediately. Follow Cardiac Low Salt Diet and 1.5 lit/day fluid restriction.   On your next visit with your primary care physician please Get Medicines reviewed and adjusted.   Please request your Prim.MD to go over all Hospital Tests and Procedure/Radiological results at the follow up, please get all Hospital records sent to your Prim MD by signing hospital release before you go home.   If you experience worsening of your admission symptoms, develop shortness of breath, life threatening emergency, suicidal or homicidal thoughts you must seek medical attention immediately by calling 911 or calling your MD immediately  if symptoms less severe.  You Must read complete instructions/literature along with all the possible adverse reactions/side effects for all the Medicines you take and that have been prescribed to you. Take any new Medicines after you have completely understood and accpet all the possible adverse reactions/side effects.   Do not drive,  operating heavy machinery, perform activities at heights, swimming or participation in water activities or provide baby sitting services if your were admitted for syncope or siezures until you have seen by Primary MD or a Neurologist and advised to do so again.  Do not drive when taking Pain medications.    Do not take more than prescribed Pain, Sleep and Anxiety Medications  Special Instructions: If you have smoked or chewed Tobacco  in the last 2 yrs please stop smoking, stop any regular Alcohol  and or any Recreational drug use.  Wear Seat belts while driving.   Please note  You were cared for by a hospitalist during your hospital stay. If you have any questions about your discharge medications or the care you received while you were in the hospital after you are discharged, you can call the unit and asked to speak with the hospitalist on call if the hospitalist that took care of you is not available. Once you are discharged, your primary care physician will handle any further medical issues. Please note that NO REFILLS for any discharge medications will be authorized once you are discharged, as it is imperative that you return to your primary care physician (or establish a relationship with a primary care physician if you do not have one) for your aftercare needs so that they can reassess your need for medications and monitor your lab values.   Increase activity slowly   Complete by: As directed      Allergies as of 11/12/2020      Reactions   Penicillin G Other (See Comments)   Unknown (childhood)   Lisinopril Other (See Comments)   Varified By [facility] unknown      Medication List    TAKE these medications   aspirin 81 MG chewable tablet Chew 81 mg by mouth daily.   Carboxymethylcellulose Sod PF 1 % Gel Place 1 drop into both eyes at bedtime.   Cholecalciferol 50 MCG (2000 UT) Tabs Take 2,000 Units by mouth daily.   dexamethasone 6 MG tablet Commonly known as:  DECADRON Take 1 tablet (6 mg total) by mouth daily. Start taking on: November 13, 2020   dorzolamidel-timolol 22.3-6.8 MG/ML Soln ophthalmic solution Commonly known as: COSOPT Place 1 drop into both eyes 2 (two) times daily.   feeding supplement Liqd Take 237 mLs by mouth 3 (three) times daily between meals.   latanoprost  0.005 % ophthalmic solution Commonly known as: XALATAN Place 1 drop into both eyes at bedtime.   pantoprazole 40 MG tablet Commonly known as: Protonix Take 1 tablet (40 mg total) by mouth daily.   rosuvastatin 5 MG tablet Commonly known as: CRESTOR Take 1 tablet (5 mg total) by mouth daily. Start taking on: November 13, 2020   vitamin C 1000 MG tablet Take 1,000 mg by mouth daily.   warfarin 5 MG tablet Commonly known as: COUMADIN Please take 5 mg oral every evening starting tomorrow 12/17, for nex 3 days, then discuss with your warfarin clinic at the [facility] the appropriate dose to continue, as they will follow on your INR every  48-hour for next few days. What changed:   how much to take  how to take this  when to take this  additional instructions         Diet and Activity recommendation: See Discharge Instructions above   Consults obtained -  Neurology   Major procedures and Radiology Reports - PLEASE review detailed and final reports for all details, in brief -    CT HEAD WO CONTRAST  Result Date: 11/07/2020 CLINICAL DATA:  Neuro deficit, hemorrhage or stroke suspected EXAM: CT HEAD WITHOUT CONTRAST TECHNIQUE: Contiguous axial images were obtained from the base of the skull through the vertex without intravenous contrast. COMPARISON:  None. FINDINGS: Brain: No evidence of acute infarction, hemorrhage, hydrocephalus, extra-axial collection or mass lesion/mass effect. Periventricular and deep white matter hypodensity. Multiple small lacunar infarctions of the bilateral basal ganglia. Vascular: No hyperdense vessel or unexpected calcification.  Skull: Normal. Negative for fracture or focal lesion. Sinuses/Orbits: No acute finding. Other: None. IMPRESSION: No acute intracranial pathology. Small-vessel white matter disease and multiple small lacunar infarctions of the bilateral basal ganglia. Consider MRI to more sensitively evaluate for acute diffusion restricting infarction if suspected. Electronically Signed   By: Eddie Candle M.D.   On: 11/07/2020 13:35   MR BRAIN WO CONTRAST  Result Date: 11/08/2020 CLINICAL DATA:  74 year old male positive COVID-19. Neurologic deficit. EXAM: MRI HEAD WITHOUT CONTRAST TECHNIQUE: Multiplanar, multiecho pulse sequences of the brain and surrounding structures were obtained without intravenous contrast. COMPARISON:  Head CT yesterday. FINDINGS: Brain: Punctate abnormal trace diffusion in the right corona radiata, series 5, image 85. Subtle if any diffusion restriction on ADC. No other restricted diffusion. However, numerous chronic lacunar infarcts are scattered throughout the bilateral basal ganglia, bilateral thalami, bilateral pons, and the left cerebellar hemisphere. Multiple chronic microhemorrhages in those areas. Chronic microhemorrhage also at the left frontal operculum. Superimposed Patchy and confluent bilateral additional cerebral white matter T2 and FLAIR hyperintensity, with some areas of cystic white matter encephalomalacia. No cerebral cortex encephalomalacia identified. No midline shift, mass effect, evidence of mass lesion, ventriculomegaly, extra-axial collection or acute intracranial hemorrhage. Cervicomedullary junction and pituitary are within normal limits. Vascular: Major intracranial vascular flow voids are preserved. The right vertebral artery appears dominant. Skull and upper cervical spine: Negative. Visualized bone marrow signal is within normal limits. Sinuses/Orbits: Postoperative changes to both globes. Otherwise negative orbits. Paranasal sinuses and mastoids are stable and well  pneumatized. Other: Visible internal auditory structures appear normal. Trace retained secretions in the nasopharynx. Visible scalp and face appear negative. IMPRESSION: 1. Punctate acute to subacute lacunar infarct in the right corona radiata. No associated hemorrhage or mass effect. 2. Underlying very severe chronic small vessel disease, including several chronic micro-hemorrhages. Electronically Signed   By: Genevie Ann M.D.   On: 11/08/2020 15:43  DG CHEST PORT 1 VIEW  Result Date: 11/08/2020 CLINICAL DATA:  Acute hypoxemic respiratory failure due to covid 19 EXAM: PORTABLE CHEST 1 VIEW COMPARISON:  11/06/2020 FINDINGS: Sternotomy wires overlie normal cardiac silhouette. No effusion, consolidation, or pneumothorax. Fine airspace disease in the LEFT lower lobe. IMPRESSION: Very fine airspace disease in LEFT lower lobe. No significant oval change. Electronically Signed   By: Suzy Bouchard M.D.   On: 11/08/2020 06:12   DG Chest Port 1 View  Result Date: 11/06/2020 CLINICAL DATA:  Short of breath EXAM: PORTABLE CHEST 1 VIEW COMPARISON:  None. FINDINGS: Post sternotomy changes. Mild reticular opacity at the left base, possible mild fibrosis. No acute consolidation or effusion. Normal heart size. Aortic atherosclerosis. No pneumothorax. Clips at the upper mediastinum. IMPRESSION: 1. No focal airspace disease 2. Possible reticular changes/mild fibrosis at the left base Electronically Signed   By: Donavan Foil M.D.   On: 11/06/2020 20:23    Micro Results    Recent Results (from the past 240 hour(s))  Blood Culture (routine x 2)     Status: None (Preliminary result)   Collection Time: 11/06/20  7:56 PM   Specimen: BLOOD  Result Value Ref Range Status   Specimen Description   Final    BLOOD LEFT WRIST Performed at [facility] [address], [place], [place] [address]    Special Requests   Final    BOTTLES DRAWN AEROBIC AND ANAEROBIC Blood Culture adequate volume Performed  at [facility] [address], [place], [place] [address]    Culture   Final    NO GROWTH 4 DAYS Performed at [facility], [place] [address] [place], [place] [address]    Report Status PENDING  Incomplete  Blood Culture (routine x 2)     Status: None (Preliminary result)   Collection Time: 11/06/20  8:01 PM   Specimen: BLOOD  Result Value Ref Range Status   Specimen Description   Final    BLOOD BLOOD LEFT FOREARM Performed at [facility] [address] [place], [place] [address]    Special Requests   Final    BOTTLES DRAWN AEROBIC AND ANAEROBIC Blood Culture adequate volume Performed at [facility] [address] [place], [place] [address]    Culture   Final    NO GROWTH 4 DAYS Performed at [facility], [place] [address] [place], [place] [address]    Report Status PENDING  Incomplete  Resp Panel by RT-PCR (Flu A&B, Covid) Nasopharyngeal Swab     Status: Abnormal   Collection Time: 11/06/20  9:08 PM   Specimen: Nasopharyngeal Swab; Nasopharyngeal(NP) swabs in vial transport medium  Result Value Ref Range Status   SARS Coronavirus 2 by RT PCR POSITIVE (A) NEGATIVE Final    Comment: SARA DOSTER RN 11/06/2020_0  BY P.HENDERSON (NOTE) SARS-CoV-2 target nucleic acids are DETECTED.  The SARS-CoV-2 RNA is generally detectable in upper respiratory specimens during the acute phase of infection. Positive results are indicative of the presence of the identified virus, but do not rule out bacterial infection or co-infection with other pathogens not detected by the test. Clinical correlation with patient history and other diagnostic information is necessary to determine patient infection status. The expected result is Negative.  Fact Sheet for Patients: EntrepreneurPulse.com.au  Fact Sheet for Healthcare Providers: IncredibleEmployment.be  This test is not yet approved or  cleared by the [place] FDA and  has been authorized for detection  and/or diagnosis of SARS-CoV-2 by FDA under an Emergency Use Authorization (EUA).  This EUA will remain in effect (meaning this test can be used) for the duration of  the COVID- 19 declaration under Section 564(b)(1) of the Act, 21 U.S.C. section 360bbb-3(b)(1), unless the authorization is terminated or revoked sooner.     Influenza A by PCR NEGATIVE NEGATIVE Final   Influenza B by PCR NEGATIVE NEGATIVE Final    Comment: (NOTE) The Xpert Xpress SARS-CoV-2/FLU/RSV plus assay is intended as an aid in the diagnosis of influenza from Nasopharyngeal swab specimens and should not be used as a sole basis for treatment. Nasal washings and aspirates are unacceptable for Xpert Xpress SARS-CoV-2/FLU/RSV testing.  Fact Sheet for Patients: EntrepreneurPulse.com.au  Fact Sheet for Healthcare Providers: IncredibleEmployment.be  This test is not yet approved or cleared by the [place] FDA and has been authorized for detection and/or diagnosis of SARS-CoV-2 by FDA under an Emergency Use Authorization (EUA). This EUA will remain in effect (meaning this test can be used) for the duration of the COVID-19 declaration under Section 564(b)(1) of the Act, 21 U.S.C. section 360bbb-3(b)(1), unless the authorization is terminated or revoked.  Performed at [facility], [place] [address], [place], [place] [address]        Today   Subjective:   Jamie Young today has no headache,no chest or abdominal pain, denies any dyspnea at this point, he has no oxygen requirement . -Patient is adamant about going home today, he did decline going to a facility, as well he did declined to stay for another day .  Objective:   Blood pressure 135/74, pulse 86, temperature 98.2 F (36.8 C), temperature source Oral, resp. rate 18, height _0  (1.803 m), weight 63.5 kg, SpO2 92  %.   Intake/Output Summary (Last 24 hours) at 11/12/2020 1340 Last data filed at 11/12/2020 1315 Gross per 24 hour  Intake 240 ml  Output 875 ml  Net -635 ml    Exam Awake Alert, Oriented x 3, appropriate and coherent, extremely frail.   Symmetrical Chest wall movement, Good air movement bilaterally, CTAB RRR,No Gallops,Rubs or new Murmurs, No Parasternal Heave +ve B.Sounds, Abd Soft, Non tender,  No rebound -guarding or rigidity. No Cyanosis, Clubbing or edema, No new Rash or bruise  Data Review   CBC w Diff:  Lab Results  Component Value Date   WBC 12.3 (H) 11/12/2020   HGB 13.4 11/12/2020   HCT 38.1 (L) 11/12/2020   PLT 205 11/12/2020   LYMPHOPCT 2 11/09/2020   MONOPCT 4 11/09/2020   EOSPCT 0 11/09/2020   BASOPCT 0 11/09/2020    CMP:  Lab Results  Component Value Date   NA 126 (L) 11/12/2020   K 4.1 11/12/2020   CL 94 (L) 11/12/2020   CO2 23 11/12/2020   BUN 22 11/12/2020   CREATININE 0.55 (L) 11/12/2020   PROT 5.7 (L) 11/12/2020   ALBUMIN 3.0 (L) 11/12/2020   BILITOT 0.9 11/12/2020   ALKPHOS 50 11/12/2020   AST 36 11/12/2020   ALT 40 11/12/2020  .   Total Time in preparing paper work, data evaluation and todays exam - 58 minutes  Phillips Climes M.D on 11/12/2020 at 1:40 PM  [facility]   Office  [phone]

## 2020-11-12 NOTE — Discharge Instructions (Signed)
10 Things You Can Do to Manage Your COVID-19 Symptoms at Home If you have possible or confirmed COVID-19: 1. Stay home from work and school. And stay away from other public places. If you must go out, avoid using any kind of public transportation, ridesharing, or taxis. 2. Monitor your symptoms carefully. If your symptoms get worse, call your healthcare provider immediately. 3. Get rest and stay hydrated. 4. If you have a medical appointment, call the healthcare provider ahead of time and tell them that you have or may have COVID-19. 5. For medical emergencies, call 911 and notify the dispatch personnel that you have or may have COVID-19. 6. Cover your cough and sneezes with a tissue or use the inside of your elbow. 7. Wash your hands often with soap and water for at least 20 seconds or clean your hands with an alcohol-based hand sanitizer that contains at least 60% alcohol. 8. As much as possible, stay in a specific room and away from other people in your home. Also, you should use a separate bathroom, if available. If you need to be around other people in or outside of the home, wear a mask. 9. Avoid sharing personal items with other people in your household, like dishes, towels, and bedding. 10. Clean all surfaces that are touched often, like counters, tabletops, and doorknobs. Use household cleaning sprays or wipes according to the label instructions. [email or web address] 05/29/2019 This information is not intended to replace advice given to you by your health care provider. Make sure you discuss any questions you have with your health care provider. Document Revised: 10/31/2019 Document Reviewed: 10/31/2019 Elsevier Patient Education  Manuel Garcia Can Do to Manage Your COVID-19 Symptoms at Home If you have possible or confirmed COVID-19: 11. Stay home from work and school. And stay away from other public places. If you must go out, avoid using any kind of public  transportation, ridesharing, or taxis. 12. Monitor your symptoms carefully. If your symptoms get worse, call your healthcare provider immediately. 13. Get rest and stay hydrated. 14. If you have a medical appointment, call the healthcare provider ahead of time and tell them that you have or may have COVID-19. 15. For medical emergencies, call 911 and notify the dispatch personnel that you have or may have COVID-19. 16. Cover your cough and sneezes with a tissue or use the inside of your elbow. 15. Wash your hands often with soap and water for at least 20 seconds or clean your hands with an alcohol-based hand sanitizer that contains at least 60% alcohol. 18. As much as possible, stay in a specific room and away from other people in your home. Also, you should use a separate bathroom, if available. If you need to be around other people in or outside of the home, wear a mask. 19. Avoid sharing personal items with other people in your household, like dishes, towels, and bedding. 20. Clean all surfaces that are touched often, like counters, tabletops, and doorknobs. Use household cleaning sprays or wipes according to the label instructions. [email or web address] 05/29/2019 This information is not intended to replace advice given to you by your health care provider. Make sure you discuss any questions you have with your health care provider. Document Revised: 10/31/2019 Document Reviewed: 10/31/2019 Elsevier Patient Education  2020 [place].   COVID-19 COVID-19 is a respiratory infection that is caused by a virus called severe acute respiratory syndrome coronavirus 2 (SARS-CoV-2). The disease is also known as coronavirus disease  or novel coronavirus. In some people, the virus may not cause any symptoms. In others, it may cause a serious infection. The infection can get worse quickly and can lead to complications, such as:  Pneumonia, or infection of the lungs.  Acute respiratory distress syndrome  or ARDS. This is a condition in which fluid build-up in the lungs prevents the lungs from filling with air and passing oxygen into the blood.  Acute respiratory failure. This is a condition in which there is not enough oxygen passing from the lungs to the body or when carbon dioxide is not passing from the lungs out of the body.  Sepsis or septic shock. This is a serious bodily reaction to an infection.  Blood clotting problems.  Secondary infections due to bacteria or fungus.  Organ failure. This is when your body's organs stop working. The virus that causes COVID-19 is contagious. This means that it can spread from person to person through droplets from coughs and sneezes (respiratory secretions). What are the causes? This illness is caused by a virus. You may catch the virus by:  Breathing in droplets from an infected person. Droplets can be spread by a person breathing, speaking, singing, coughing, or sneezing.  Touching something, like a table or a doorknob, that was exposed to the virus (contaminated) and then touching your mouth, nose, or eyes. What increases the risk? Risk for infection You are more likely to be infected with this virus if you:  Are within 6 feet (2 meters) of a person with COVID-19.  Provide care for or live with a person who is infected with COVID-19.  Spend time in crowded indoor spaces or live in shared housing. Risk for serious illness You are more likely to become seriously ill from the virus if you:  Are 68 years of age or older. The higher your age, the more you are at risk for serious illness.  Live in a nursing home or long-term care facility.  Have cancer.  Have a long-term (chronic) disease such as: ? Chronic lung disease, including chronic obstructive pulmonary disease or asthma. ? A long-term disease that lowers your body's ability to fight infection (immunocompromised). ? Heart disease, including heart failure, a condition in which the  arteries that lead to the heart become narrow or blocked (coronary artery disease), a disease which makes the heart muscle thick, weak, or stiff (cardiomyopathy). ? Diabetes. ? Chronic kidney disease. ? Sickle cell disease, a condition in which red blood cells have an abnormal "sickle" shape. ? Liver disease.  Are obese. What are the signs or symptoms? Symptoms of this condition can range from mild to severe. Symptoms may appear any time from 2 to 14 days after being exposed to the virus. They include:  A fever or chills.  A cough.  Difficulty breathing.  Headaches, body aches, or muscle aches.  Runny or stuffy (congested) nose.  A sore throat.  New loss of taste or smell. Some people may also have stomach problems, such as nausea, vomiting, or diarrhea. Other people may not have any symptoms of COVID-19. How is this diagnosed? This condition may be diagnosed based on:  Your signs and symptoms, especially if: ? You live in an area with a COVID-19 outbreak. ? You recently traveled to or from an area where the virus is common. ? You provide care for or live with a person who was diagnosed with COVID-19. ? You were exposed to a person who was diagnosed with COVID-19.  A  physical exam.  Lab tests, which may include: ? Taking a sample of fluid from the back of your nose and throat (nasopharyngeal fluid), your nose, or your throat using a swab. ? A sample of mucus from your lungs (sputum). ? Blood tests.  Imaging tests, which may include, X-rays, CT scan, or ultrasound. How is this treated? At present, there is no medicine to treat COVID-19. Medicines that treat other diseases are being used on a trial basis to see if they are effective against COVID-19. Your health care provider will talk with you about ways to treat your symptoms. For most people, the infection is mild and can be managed at home with rest, fluids, and over-the-counter medicines. Treatment for a serious  infection usually takes places in a hospital intensive care unit (ICU). It may include one or more of the following treatments. These treatments are given until your symptoms improve.  Receiving fluids and medicines through an IV.  Supplemental oxygen. Extra oxygen is given through a tube in the nose, a face mask, or a hood.  Positioning you to lie on your stomach (prone position). This makes it easier for oxygen to get into the lungs.  Continuous positive airway pressure (CPAP) or bi-level positive airway pressure (BPAP) machine. This treatment uses mild air pressure to keep the airways open. A tube that is connected to a motor delivers oxygen to the body.  Ventilator. This treatment moves air into and out of the lungs by using a tube that is placed in your windpipe.  Tracheostomy. This is a procedure to create a hole in the neck so that a breathing tube can be inserted.  Extracorporeal membrane oxygenation (ECMO). This procedure gives the lungs a chance to recover by taking over the functions of the heart and lungs. It supplies oxygen to the body and removes carbon dioxide. Follow these instructions at home: Lifestyle  If you are sick, stay home except to get medical care. Your health care provider will tell you how long to stay home. Call your health care provider before you go for medical care.  Rest at home as told by your health care provider.  Do not use any products that contain nicotine or tobacco, such as cigarettes, e-cigarettes, and chewing tobacco. If you need help quitting, ask your health care provider.  Return to your normal activities as told by your health care provider. Ask your health care provider what activities are safe for you. General instructions  Take over-the-counter and prescription medicines only as told by your health care provider.  Drink enough fluid to keep your urine pale yellow.  Keep all follow-up visits as told by your health care provider. This is  important. How is this prevented?  There is no vaccine to help prevent COVID-19 infection. However, there are steps you can take to protect yourself and others from this virus. To protect yourself:   Do not travel to areas where COVID-19 is a risk. The areas where COVID-19 is reported change often. To identify high-risk areas and travel restrictions, check the CDC travel website: FatFares.com.br  If you live in, or must travel to, an area where COVID-19 is a risk, take precautions to avoid infection. ? Stay away from people who are sick. ? Wash your hands often with soap and water for 20 seconds. If soap and water are not available, use an alcohol-based hand sanitizer. ? Avoid touching your mouth, face, eyes, or nose. ? Avoid going out in public, follow guidance from your  state and local health authorities. ? If you must go out in public, wear a cloth face covering or face mask. Make sure your mask covers your nose and mouth. ? Avoid crowded indoor spaces. Stay at least 6 feet (2 meters) away from others. ? Disinfect objects and surfaces that are frequently touched every day. This may include:  Counters and tables.  Doorknobs and light switches.  Sinks and faucets.  Electronics, such as phones, remote controls, keyboards, computers, and tablets. To protect others: If you have symptoms of COVID-19, take steps to prevent the virus from spreading to others.  If you think you have a COVID-19 infection, contact your health care provider right away. Tell your health care team that you think you may have a COVID-19 infection.  Stay home. Leave your house only to seek medical care. Do not use public transport.  Do not travel while you are sick.  Wash your hands often with soap and water for 20 seconds. If soap and water are not available, use alcohol-based hand sanitizer.  Stay away from other members of your household. Let healthy household members care for children and pets,  if possible. If you have to care for children or pets, wash your hands often and wear a mask. If possible, stay in your own room, separate from others. Use a different bathroom.  Make sure that all people in your household wash their hands well and often.  Cough or sneeze into a tissue or your sleeve or elbow. Do not cough or sneeze into your hand or into the air.  Wear a cloth face covering or face mask. Make sure your mask covers your nose and mouth. Where to find more information  Centers for Disease Control and Prevention: PurpleGadgets.be  World Health Organization: https://www.castaneda.info/ Contact a health care provider if:  You live in or have traveled to an area where COVID-19 is a risk and you have symptoms of the infection.  You have had contact with someone who has COVID-19 and you have symptoms of the infection. Get help right away if:  You have trouble breathing.  You have pain or pressure in your chest.  You have confusion.  You have bluish lips and fingernails.  You have difficulty waking from sleep.  You have symptoms that get worse. These symptoms may represent a serious problem that is an emergency. Do not wait to see if the symptoms will go away. Get medical help right away. Call your local emergency services (911 in the [place] Do not drive yourself to the hospital. Let the emergency medical personnel know if you think you have COVID-19. Summary  COVID-19 is a respiratory infection that is caused by a virus. It is also known as coronavirus disease or novel coronavirus. It can cause serious infections, such as pneumonia, acute respiratory distress syndrome, acute respiratory failure, or sepsis.  The virus that causes COVID-19 is contagious. This means that it can spread from person to person through droplets from breathing, speaking, singing, coughing, or sneezing.  You are more likely to develop a serious illness if you  are 66 years of age or older, have a weak immune system, live in a nursing home, or have chronic disease.  There is no medicine to treat COVID-19. Your health care provider will talk with you about ways to treat your symptoms.  Take steps to protect yourself and others from infection. Wash your hands often and disinfect objects and surfaces that are frequently touched every day. Stay away  from people who are sick and wear a mask if you are sick. This information is not intended to replace advice given to you by your health care provider. Make sure you discuss any questions you have with your health care provider. Document Revised: 09/13/2019 Document Reviewed: 12/20/2018 Elsevier Patient Education  [place].   Acute Respiratory Failure, Adult  Acute respiratory failure occurs when there is not enough oxygen passing from your lungs to your body. When this happens, your lungs have trouble removing carbon dioxide from the blood. This causes your blood oxygen level to drop too low as carbon dioxide builds up. Acute respiratory failure is a medical emergency. It can develop quickly, but it is temporary if treated promptly. Your lung capacity, or how much air your lungs can hold, may improve with time, exercise, and treatment. What are the causes? There are many possible causes of acute respiratory failure, including:  Lung injury.  Chest injury or damage to the ribs or tissues near the lungs.  Lung conditions that affect the flow of air and blood into and out of the lungs, such as pneumonia, acute respiratory distress syndrome, and cystic fibrosis.  Medical conditions, such as strokes or spinal cord injuries, that affect the muscles and nerves that control breathing.  Blood infection (sepsis).  Inflammation of the pancreas (pancreatitis).  A blood clot in the lungs (pulmonary embolism).  A large-volume blood transfusion.  Burns.  Near-drowning.  Seizure.  Smoke  inhalation.  Reaction to medicines.  Alcohol or drug overdose. What increases the risk? This condition is more likely to develop in people who have:  A blocked airway.  Asthma.  A condition or disease that damages or weakens the muscles, nerves, bones, or tissues that are involved in breathing.  A serious infection.  A health problem that blocks the unconscious reflex that is involved in breathing, such as hypothyroidism or sleep apnea.  A lung injury or trauma. What are the signs or symptoms? Trouble breathing is the main symptom of acute respiratory failure. Symptoms may also include:  Rapid breathing.  Restlessness or anxiety.  Skin, lips, or fingernails that appear blue (cyanosis).  Rapid heart rate.  Abnormal heart rhythms (arrhythmias).  Confusion or changes in behavior.  Tiredness or loss of energy.  Feeling sleepy or having a loss of consciousness. How is this diagnosed? Your health care provider can diagnose acute respiratory failure with a medical history and physical exam. During the exam, your health care provider will listen to your heart and check for crackling or wheezing sounds in your lungs. Your may also have tests to confirm the diagnosis and determine what is causing respiratory failure. These tests may include:  Measuring the amount of oxygen in your blood (pulse oximetry). The measurement comes from a small device that is placed on your finger, earlobe, or toe.  Other blood tests to measure blood gases and to look for signs of infection.  Sampling your cerebral spinal fluid or tracheal fluid to check for infections.  Chest X-ray to look for fluid in spaces that should be filled with air.  Electrocardiogram (ECG) to look at the heart's electrical activity. How is this treated? Treatment for this condition usually takes places in a hospital intensive care unit (ICU). Treatment depends on what is causing the condition. It may include one or more  treatments until your symptoms improve. Treatment may include:  Supplemental oxygen. Extra oxygen is given through a tube in the nose, a face mask, or a  hood.  A device such as a continuous positive airway pressure (CPAP) or bi-level positive airway pressure (BiPAP or BPAP) machine. This treatment uses mild air pressure to keep the airways open. A mask or other device will be placed over your nose or mouth. A tube that is connected to a motor will deliver oxygen through the mask.  Ventilator. This treatment helps move air into and out of the lungs. This may be done with a bag and mask or a machine. For this treatment, a tube is placed in your windpipe (trachea) so air and oxygen can flow to the lungs.  Extracorporeal membrane oxygenation (ECMO). This treatment temporarily takes over the function of the heart and lungs, supplying oxygen and removing carbon dioxide. ECMO gives the lungs a chance to recover. It may be used if a ventilator is not effective.  Tracheostomy. This is a procedure that creates a hole in the neck to insert a breathing tube.  Receiving fluids and medicines.  Rocking the bed to help breathing. Follow these instructions at home:  Take over-the-counter and prescription medicines only as told by your health care provider.  Return to normal activities as told by your health care provider. Ask your health care provider what activities are safe for you.  Keep all follow-up visits as told by your health care provider. This is important. How is this prevented? Treating infections and medical conditions that may lead to acute respiratory failure can help prevent the condition from developing. Contact a health care provider if:  You have a fever.  Your symptoms do not improve or they get worse. Get help right away if:  You are having trouble breathing.  You lose consciousness.  Your have cyanosis or turn blue.  You develop a rapid heart rate.  You are  confused. These symptoms may represent a serious problem that is an emergency. Do not wait to see if the symptoms will go away. Get medical help right away. Call your local emergency services (911 in the [place] Do not drive yourself to the hospital. This information is not intended to replace advice given to you by your health care provider. Make sure you discuss any questions you have with your health care provider. Document Revised: 10/27/2017 Document Reviewed: 06/01/2016 Elsevier Patient Education  [place] Under Monitoring Name: Jamie Young  Location: [address] [facility] [place] [address]   Infection Prevention Recommendations for Individuals Confirmed to have, or Being Evaluated for, 2019 Novel Coronavirus (COVID-19) Infection Who Receive Care at Home  Individuals who are confirmed to have, or are being evaluated for, COVID-19 should follow the prevention steps below until a healthcare provider or local or state health department says they can return to normal activities.  Stay home except to get medical care You should restrict activities outside your home, except for getting medical care. Do not go to work, school, or public areas, and do not use public transportation or taxis.  Call ahead before visiting your doctor Before your medical appointment, call the healthcare provider and tell them that you have, or are being evaluated for, COVID-19 infection. This will help the healthcare provider's office take steps to keep other people from getting infected. Ask your healthcare provider to call the local or state health department.  Monitor your symptoms Seek prompt medical attention if your illness is worsening (e.g., difficulty breathing). Before going to your medical appointment, call the healthcare provider and tell them that you have, or are  being evaluated for, COVID-19 infection. Ask your healthcare provider to call the local or state health  department.  Wear a facemask You should wear a facemask that covers your nose and mouth when you are in the same room with other people and when you visit a healthcare provider. People who live with or visit you should also wear a facemask while they are in the same room with you.  Separate yourself from other people in your home As much as possible, you should stay in a different room from other people in your home. Also, you should use a separate bathroom, if available.  Avoid sharing household items You should not share dishes, drinking glasses, cups, eating utensils, towels, bedding, or other items with other people in your home. After using these items, you should wash them thoroughly with soap and water.  Cover your coughs and sneezes Cover your mouth and nose with a tissue when you cough or sneeze, or you can cough or sneeze into your sleeve. Throw used tissues in a lined trash can, and immediately wash your hands with soap and water for at least 20 seconds or use an alcohol-based hand rub.  Wash your [facility] your hands often and thoroughly with soap and water for at least 20 seconds. You can use an alcohol-based hand sanitizer if soap and water are not available and if your hands are not visibly dirty. Avoid touching your eyes, nose, and mouth with unwashed hands.   Prevention Steps for Caregivers and Household Members of Individuals Confirmed to have, or Being Evaluated for, COVID-19 Infection Being Cared for in the Home  If you live with, or provide care at home for, a person confirmed to have, or being evaluated for, COVID-19 infection please follow these guidelines to prevent infection:  Follow healthcare provider's instructions Make sure that you understand and can help the patient follow any healthcare provider instructions for all care.  Provide for the patient's basic needs You should help the patient with basic needs in the home and provide support for getting  groceries, prescriptions, and other personal needs.  Monitor the patient's symptoms If they are getting sicker, call his or her medical provider and tell them that the patient has, or is being evaluated for, COVID-19 infection. This will help the healthcare provider's office take steps to keep other people from getting infected. Ask the healthcare provider to call the local or state health department.  Limit the number of people who have contact with the patient  If possible, have only one caregiver for the patient.  Other household members should stay in another home or place of residence. If this is not possible, they should stay  in another room, or be separated from the patient as much as possible. Use a separate bathroom, if available.  Restrict visitors who do not have an essential need to be in the home.  Keep older adults, very young children, and other sick people away from the patient Keep older adults, very young children, and those who have compromised immune systems or chronic health conditions away from the patient. This includes people with chronic heart, lung, or kidney conditions, diabetes, and cancer.  Ensure good ventilation Make sure that shared spaces in the home have good air flow, such as from an air conditioner or an opened window, weather permitting.  Wash your hands often  Wash your hands often and thoroughly with soap and water for at least 20 seconds. You can use an alcohol based  hand sanitizer if soap and water are not available and if your hands are not visibly dirty.  Avoid touching your eyes, nose, and mouth with unwashed hands.  Use disposable paper towels to dry your hands. If not available, use dedicated cloth towels and replace them when they become wet.  Wear a facemask and gloves  Wear a disposable facemask at all times in the room and gloves when you touch or have contact with the patient's blood, body fluids, and/or secretions or excretions,  such as sweat, saliva, sputum, nasal mucus, vomit, urine, or feces.  Ensure the mask fits over your nose and mouth tightly, and do not touch it during use.  Throw out disposable facemasks and gloves after using them. Do not reuse.  Wash your hands immediately after removing your facemask and gloves.  If your personal clothing becomes contaminated, carefully remove clothing and launder. Wash your hands after handling contaminated clothing.  Place all used disposable facemasks, gloves, and other waste in a lined container before disposing them with other household waste.  Remove gloves and wash your hands immediately after handling these items.  Do not share dishes, glasses, or other household items with the patient  Avoid sharing household items. You should not share dishes, drinking glasses, cups, eating utensils, towels, bedding, or other items with a patient who is confirmed to have, or being evaluated for, COVID-19 infection.  After the person uses these items, you should wash them thoroughly with soap and water.  Wash laundry thoroughly  Immediately remove and wash clothes or bedding that have blood, body fluids, and/or secretions or excretions, such as sweat, saliva, sputum, nasal mucus, vomit, urine, or feces, on them.  Wear gloves when handling laundry from the patient.  Read and follow directions on labels of laundry or clothing items and detergent. In general, wash and dry with the warmest temperatures recommended on the label.  Clean all areas the individual has used often  Clean all touchable surfaces, such as counters, tabletops, doorknobs, bathroom fixtures, toilets, phones, keyboards, tablets, and bedside tables, every day. Also, clean any surfaces that may have blood, body fluids, and/or secretions or excretions on them.  Wear gloves when cleaning surfaces the patient has come in contact with.  Use a diluted bleach solution (e.g., dilute bleach with 1 part bleach and 10  parts water) or a household disinfectant with a label that says EPA-registered for coronaviruses. To make a bleach solution at home, add 1 tablespoon of bleach to 1 quart (4 cups) of water. For a larger supply, add  cup of bleach to 1 gallon (16 cups) of water.  Read labels of cleaning products and follow recommendations provided on product labels. Labels contain instructions for safe and effective use of the cleaning product including precautions you should take when applying the product, such as wearing gloves or eye protection and making sure you have good ventilation during use of the product.  Remove gloves and wash hands immediately after cleaning.  Monitor yourself for signs and symptoms of illness Caregivers and household members are considered close contacts, should monitor their health, and will be asked to limit movement outside of the home to the extent possible. Follow the monitoring steps for close contacts listed on the symptom monitoring form.   ? If you have additional questions, contact your local health department or call the epidemiologist on call at [phone] (available 24/7). ? This guidance is subject to change. For the most up-to-date guidance from [facility], please refer to  their website: [email or web address]

## 2020-11-12 NOTE — Plan of Care (Signed)
  Problem: Education: Goal: Knowledge of risk factors and measures for prevention of condition will improve 11/12/2020 0441 by Lonia Skinner, RN Outcome: Progressing 11/11/2020 1922 by Lonia Skinner, RN Outcome: Adequate for Discharge   Problem: Respiratory: Goal: Will maintain a patent airway 11/12/2020 0441 by Lonia Skinner, RN Outcome: Progressing 11/11/2020 1922 by Lonia Skinner, RN Outcome: Adequate for Discharge Goal: Complications related to the disease process, condition or treatment will be avoided or minimized Outcome: Progressing   Problem: Health Behavior/Discharge Planning: Goal: Ability to manage health-related needs will improve 11/12/2020 0441 by Lonia Skinner, RN Outcome: Progressing 11/11/2020 1922 by Lonia Skinner, RN Outcome: Adequate for Discharge   Problem: Clinical Measurements: Goal: Ability to maintain clinical measurements within normal limits will improve Outcome: Progressing Goal: Will remain free from infection Outcome: Progressing Goal: Diagnostic test results will improve 11/12/2020 0441 by Lonia Skinner, RN Outcome: Progressing 11/11/2020 1922 by Lonia Skinner, RN Outcome: Progressing Goal: Respiratory complications will improve 11/12/2020 0441 by Lonia Skinner, RN Outcome: Progressing 11/11/2020 1922 by Lonia Skinner, RN Outcome: Adequate for Discharge Goal: Cardiovascular complication will be avoided Outcome: Progressing   Problem: Activity: Goal: Risk for activity intolerance will decrease Outcome: Progressing   Problem: Coping: Goal: Level of anxiety will decrease Outcome: Progressing   Problem: Elimination: Goal: Will not experience complications related to bowel motility 11/12/2020 0441 by Lonia Skinner, RN Outcome: Progressing 11/11/2020 1922 by Lonia Skinner, RN Outcome: Adequate for Discharge Goal: Will not experience complications related to urinary retention Outcome: Progressing   Problem: Pain Managment: Goal:  General experience of comfort will improve Outcome: Progressing   Problem: Safety: Goal: Ability to remain free from injury will improve Outcome: Progressing   Problem: Skin Integrity: Goal: Risk for impaired skin integrity will decrease Outcome: Progressing

## 2020-11-12 NOTE — Progress Notes (Signed)
Patient Details Name: Jamie Young MRN: [id number] DOB: 1945-12-17   SATURATION QUALIFICATIONS: (This note is used to comply with regulatory documentation for home oxygen)  Patient Saturations on Room Air at Rest = 91%  Patient Saturations on Room Air while Transferring = 86%  Patient Saturations on 2 Liters of oxygen after transfer = 89%  Please briefly explain why patient needs home oxygen:   to improve oxygen saturations during physical activity such as ADLs, transferring, and ambulating   Jessilyn Catino,KATHrine E 11/12/2020, 2:45 PM Jannette Spanner PT, DPT Acute Rehabilitation Services Pager: [phone] Office: [phone]

## 2020-11-12 NOTE — Progress Notes (Signed)
Physical Therapy Treatment Patient Details Name: Jamie Young MRN: [id number] DOB: Dec 22, 1945 Today's Date: 11/12/2020    History of Present Illness 74 y.o. male admitted with COVID-19. He was complaining of generalized weakness and bilateral leg weakness and therefore CT scan was performed which demonstrated significant cerebrovascular disease and therefore an MRI was performed which demonstrates significant white matter disease with small area that could be acute ischemia.  Per neuro note: "Impression: 74 year old male with possible acute white matter ischemia, though without correlate on ADC I think that this is markedly unclear.  In any case it is completely incidental and unrelated to his weakness.  I suspect that he may have some recrudescence of his previous stroke symptoms in the setting of his acute infection."    PT Comments    Pt agreeable to OOB.  Pt declined ambulating however agreeable to transfer to recliner.  Pt required supplemental oxygen after transfer (see saturation qualification progress note).  Pt requiring min assist for weakness and steadying.  Pt also presents as high fall risk and educated to use call bell for assist out of recliner (chair alarm also in place).  Pt wants to d/c home.  Recommend assist at home for mobility.   Follow Up Recommendations  Home health PT;Supervision for mobility/OOB (pt declines SNF)     Equipment Recommendations  None recommended by PT    Recommendations for Other Services       Precautions / Restrictions Precautions Precautions: Fall Precaution Comments: monitor O2    Mobility  Bed Mobility Overal bed mobility: Needs Assistance Bed Mobility: Supine to Sit     Supine to sit: Supervision;HOB elevated        Transfers Overall transfer level: Needs assistance Equipment used: Rolling walker (2 wheeled) Transfers: Sit to/from [facility] Sit to Stand: Min assist Stand pivot transfers: Min assist        General transfer comment: Verbal cues needed for hand placement, assist to rise and steady, steadying assist for pivoting to recliner, pt's SpO2 dropped to 86% after transferring to recliner so applied 2L O2 [name] and RN aware  Ambulation/Gait                 Stairs             Wheelchair Mobility    Modified Rankin (Stroke Patients Only)       Balance Overall balance assessment: Needs assistance         Standing balance support: Bilateral upper extremity supported Standing balance-Leahy Scale: Poor Standing balance comment: reliant on UE support at this time, unsteady with dynamic standing activities.                            Cognition Arousal/Alertness: Awake/alert   Overall Cognitive Status: Within Functional Limits for tasks assessed                                 General Comments: pt wanting to d/c home but agreeable to OOB, reports recliner has eased his left hip/back pain end of session      Exercises      General Comments        Pertinent Vitals/Pain Pain Assessment: Faces Faces Pain Scale: Hurts even more Pain Location: Left hip/low back Pain Descriptors / Indicators: Sore;Discomfort Pain Intervention(s): Limited activity within patient's tolerance;Repositioned (RN notified, pt requesting heat)    Home  Living                      Prior Function            PT Goals (current goals can now be found in the care plan section) Progress towards PT goals: Progressing toward goals    Frequency    Min 3X/week      PT Plan Current plan remains appropriate    Co-evaluation              AM-PAC PT "6 Clicks" Mobility   Outcome Measure  Help needed turning from your back to your side while in a flat bed without using bedrails?: None Help needed moving from lying on your back to sitting on the side of a flat bed without using bedrails?: None Help needed moving to and from a bed to a chair  (including a wheelchair)?: A Little Help needed standing up from a chair using your arms (Young.g., wheelchair or bedside chair)?: A Little Help needed to walk in hospital room?: A Little Help needed climbing 3-5 steps with a railing? : A Little 6 Click Score: 20    End of Session Equipment Utilized During Treatment: Gait belt;Oxygen Activity Tolerance: Patient tolerated treatment well Patient left: in chair;with call bell/phone within reach;with chair alarm set Nurse Communication: Mobility status PT Visit Diagnosis: Difficulty in walking, not elsewhere classified (R26.2)     Time: 0973-5329 PT Time Calculation (min) (ACUTE ONLY): 16 min  Charges:  $Therapeutic Activity: 8-22 mins                    Jamie Young PT, DPT Acute Rehabilitation Services Pager: [phone] Office: [phone]  Jamie Young 11/12/2020, 3:27 PM

## 2020-11-13 LAB — CULTURE, BLOOD (ROUTINE X 2)
Culture: NO GROWTH
Culture: NO GROWTH
Special Requests: ADEQUATE
Special Requests: ADEQUATE

## 2020-12-18 ENCOUNTER — Inpatient Hospital Stay
Admission: RE | Admit: 2020-12-18 | Discharge: 2021-01-26 | Disposition: A | Discharge status: Home / Self Care | Payer: No Typology Code available for payment source | Attending: Internal Medicine | Admitting: Internal Medicine

## 2020-12-18 ENCOUNTER — Other Ambulatory Visit (HOSPITAL_COMMUNITY): Payer: No Typology Code available for payment source

## 2020-12-18 DIAGNOSIS — C3492 Malignant neoplasm of unspecified part of left bronchus or lung: Secondary | ICD-10-CM | POA: Diagnosis present

## 2020-12-18 DIAGNOSIS — Z431 Encounter for attention to gastrostomy: Secondary | ICD-10-CM

## 2020-12-18 DIAGNOSIS — J9621 Acute and chronic respiratory failure with hypoxia: Secondary | ICD-10-CM | POA: Diagnosis present

## 2020-12-18 DIAGNOSIS — T85598A Other mechanical complication of other gastrointestinal prosthetic devices, implants and grafts, initial encounter: Secondary | ICD-10-CM

## 2020-12-18 DIAGNOSIS — K922 Gastrointestinal hemorrhage, unspecified: Secondary | ICD-10-CM | POA: Diagnosis present

## 2020-12-18 DIAGNOSIS — U071 COVID-19: Secondary | ICD-10-CM | POA: Diagnosis present

## 2020-12-18 DIAGNOSIS — Z4659 Encounter for fitting and adjustment of other gastrointestinal appliance and device: Secondary | ICD-10-CM

## 2020-12-18 DIAGNOSIS — J9690 Respiratory failure, unspecified, unspecified whether with hypoxia or hypercapnia: Secondary | ICD-10-CM

## 2020-12-18 DIAGNOSIS — J449 Chronic obstructive pulmonary disease, unspecified: Secondary | ICD-10-CM | POA: Diagnosis present

## 2020-12-18 DIAGNOSIS — D72829 Elevated white blood cell count, unspecified: Secondary | ICD-10-CM

## 2020-12-18 HISTORY — DX: Chronic obstructive pulmonary disease, unspecified: J44.9

## 2020-12-18 HISTORY — DX: Acute and chronic respiratory failure with hypoxia: J96.21

## 2020-12-18 HISTORY — DX: Gastrointestinal hemorrhage, unspecified: K92.2

## 2020-12-18 HISTORY — DX: COVID-19: U07.1

## 2020-12-19 LAB — CBC WITH DIFFERENTIAL/PLATELET
Abs Immature Granulocytes: 0.29 10*3/uL — ABNORMAL HIGH (ref 0.00–0.07)
Basophils Absolute: 0.1 10*3/uL (ref 0.0–0.1)
Basophils Relative: 0 %
Eosinophils Absolute: 0 10*3/uL (ref 0.0–0.5)
Eosinophils Relative: 0 %
HCT: 31.9 % — ABNORMAL LOW (ref 39.0–52.0)
Hemoglobin: 9.7 g/dL — ABNORMAL LOW (ref 13.0–17.0)
Immature Granulocytes: 2 %
Lymphocytes Relative: 7 %
Lymphs Abs: 1.4 10*3/uL (ref 0.7–4.0)
MCH: 29.5 pg (ref 26.0–34.0)
MCHC: 30.4 g/dL (ref 30.0–36.0)
MCV: 97 fL (ref 80.0–100.0)
Monocytes Absolute: 1.2 10*3/uL — ABNORMAL HIGH (ref 0.1–1.0)
Monocytes Relative: 6 %
Neutro Abs: 16.1 10*3/uL — ABNORMAL HIGH (ref 1.7–7.7)
Neutrophils Relative %: 85 %
Platelets: 520 10*3/uL — ABNORMAL HIGH (ref 150–400)
RBC: 3.29 MIL/uL — ABNORMAL LOW (ref 4.22–5.81)
RDW: 18.6 % — ABNORMAL HIGH (ref 11.5–15.5)
WBC: 19 10*3/uL — ABNORMAL HIGH (ref 4.0–10.5)
nRBC: 0 % (ref 0.0–0.2)

## 2020-12-19 LAB — COMPREHENSIVE METABOLIC PANEL
ALT: 87 U/L — ABNORMAL HIGH (ref 0–44)
AST: 48 U/L — ABNORMAL HIGH (ref 15–41)
Albumin: 2.9 g/dL — ABNORMAL LOW (ref 3.5–5.0)
Alkaline Phosphatase: 219 U/L — ABNORMAL HIGH (ref 38–126)
Anion gap: 9 (ref 5–15)
BUN: 21 mg/dL (ref 8–23)
CO2: 27 mmol/L (ref 22–32)
Calcium: 9.7 mg/dL (ref 8.9–10.3)
Chloride: 95 mmol/L — ABNORMAL LOW (ref 98–111)
Creatinine, Ser: 0.7 mg/dL (ref 0.61–1.24)
GFR, Estimated: >60 mL/min (ref >60)
Glucose, Bld: 128 mg/dL — ABNORMAL HIGH (ref 70–99)
Potassium: 5.2 mmol/L — ABNORMAL HIGH (ref 3.5–5.1)
Sodium: 131 mmol/L — ABNORMAL LOW (ref 135–145)
Total Bilirubin: 0.7 mg/dL (ref 0.3–1.2)
Total Protein: 6.7 g/dL (ref 6.5–8.1)

## 2020-12-19 LAB — HEMOGLOBIN A1C
Hgb A1c MFr Bld: 4.4 % — ABNORMAL LOW (ref 4.8–5.6)
Mean Plasma Glucose: 79.58 mg/dL

## 2020-12-19 LAB — PROTIME-INR
INR: 2.1 — ABNORMAL HIGH (ref 0.8–1.2)
Prothrombin Time: 22.6 seconds — ABNORMAL HIGH (ref 11.4–15.2)

## 2020-12-20 ENCOUNTER — Encounter: Payer: Self-pay | Admitting: Internal Medicine

## 2020-12-20 ENCOUNTER — Other Ambulatory Visit (HOSPITAL_COMMUNITY): Payer: No Typology Code available for payment source

## 2020-12-20 DIAGNOSIS — J9621 Acute and chronic respiratory failure with hypoxia: Secondary | ICD-10-CM | POA: Diagnosis not present

## 2020-12-20 DIAGNOSIS — J449 Chronic obstructive pulmonary disease, unspecified: Secondary | ICD-10-CM | POA: Diagnosis not present

## 2020-12-20 DIAGNOSIS — U071 COVID-19: Secondary | ICD-10-CM | POA: Diagnosis not present

## 2020-12-20 DIAGNOSIS — K922 Gastrointestinal hemorrhage, unspecified: Secondary | ICD-10-CM

## 2020-12-20 DIAGNOSIS — C3492 Malignant neoplasm of unspecified part of left bronchus or lung: Secondary | ICD-10-CM

## 2020-12-20 LAB — COMPREHENSIVE METABOLIC PANEL
ALT: 90 U/L — ABNORMAL HIGH (ref 0–44)
AST: 62 U/L — ABNORMAL HIGH (ref 15–41)
Albumin: 3.1 g/dL — ABNORMAL LOW (ref 3.5–5.0)
Alkaline Phosphatase: 221 U/L — ABNORMAL HIGH (ref 38–126)
Anion gap: 12 (ref 5–15)
BUN: 25 mg/dL — ABNORMAL HIGH (ref 8–23)
CO2: 23 mmol/L (ref 22–32)
Calcium: 9.8 mg/dL (ref 8.9–10.3)
Chloride: 97 mmol/L — ABNORMAL LOW (ref 98–111)
Creatinine, Ser: 0.69 mg/dL (ref 0.61–1.24)
GFR, Estimated: >60 mL/min (ref >60)
Glucose, Bld: 135 mg/dL — ABNORMAL HIGH (ref 70–99)
Potassium: 6 mmol/L — ABNORMAL HIGH (ref 3.5–5.1)
Sodium: 132 mmol/L — ABNORMAL LOW (ref 135–145)
Total Bilirubin: 1 mg/dL (ref 0.3–1.2)
Total Protein: 6.9 g/dL (ref 6.5–8.1)

## 2020-12-20 LAB — BLOOD GAS, ARTERIAL
Acid-Base Excess: 2.1 mmol/L — ABNORMAL HIGH (ref 0.0–2.0)
Acid-Base Excess: 0.7 mmol/L (ref 0.0–2.0)
Bicarbonate: 26.5 mmol/L (ref 20.0–28.0)
Bicarbonate: 25.5 mmol/L (ref 20.0–28.0)
FIO2: 100
FIO2: 60
O2 Saturation: 87.3 %
O2 Saturation: 97.8 %
Patient temperature: 37
Patient temperature: 37
pCO2 arterial: 43.8 mmHg (ref 32.0–48.0)
pCO2 arterial: 45.4 mmHg (ref 32.0–48.0)
pH, Arterial: 7.398 (ref 7.350–7.450)
pH, Arterial: 7.367 (ref 7.350–7.450)
pO2, Arterial: 57.6 mmHg — ABNORMAL LOW (ref 83.0–108.0)
pO2, Arterial: 109 mmHg — ABNORMAL HIGH (ref 83.0–108.0)

## 2020-12-20 LAB — TROPONIN I (HIGH SENSITIVITY): Troponin I (High Sensitivity): 14 ng/L (ref <18)

## 2020-12-20 LAB — CBC
HCT: 34.6 % — ABNORMAL LOW (ref 39.0–52.0)
Hemoglobin: 10.7 g/dL — ABNORMAL LOW (ref 13.0–17.0)
MCH: 30.3 pg (ref 26.0–34.0)
MCHC: 30.9 g/dL (ref 30.0–36.0)
MCV: 98 fL (ref 80.0–100.0)
Platelets: 441 10*3/uL — ABNORMAL HIGH (ref 150–400)
RBC: 3.53 MIL/uL — ABNORMAL LOW (ref 4.22–5.81)
RDW: 18.4 % — ABNORMAL HIGH (ref 11.5–15.5)
WBC: 16.4 10*3/uL — ABNORMAL HIGH (ref 4.0–10.5)
nRBC: 0 % (ref 0.0–0.2)

## 2020-12-20 LAB — PROTIME-INR
INR: 2 — ABNORMAL HIGH (ref 0.8–1.2)
Prothrombin Time: 21.7 seconds — ABNORMAL HIGH (ref 11.4–15.2)

## 2020-12-20 LAB — POTASSIUM: Potassium: 5.1 mmol/L (ref 3.5–5.1)

## 2020-12-20 NOTE — Consult Note (Signed)
Pulmonary Critical Care Medicine [place]  PULMONARY SERVICE  Date of Service: 12/20/2020  PULMONARY CRITICAL CARE CONSULT   OVILA LEPAGE  JAS:[id number]  DOB: [birth year]   DOA: 12/18/2020  Referring Physician: Merton Border, MD  HPI: Jamie Young is a 75 y.o. male seen for follow up of Acute on Chronic Respiratory Failure.  Patient has multiple medical problems including lung cancer prostate cancer stroke smoker COPD came into the hospital because of high INR.  Patient also had been having some rectal bleeding and abdominal pain.  CT of the abdomen was done which revealed a iliopsoas muscle suspicious for hematoma.  Work-up was done GI surgery evaluated the patient patient was diagnosed with hematoma.  Patient apparently developed some issues with hypotension requiring fluid resuscitation he had decompensation was intubated and extubated after passing the spontaneous breathing trials however he got worse and had to be placed back on the ventilator.  Bronchoscopy was done which revealed diffuse mucous plugging and after this was cleared patient was reassessed.  He was not able to come off and subsequently had to have a tracheostomy done he comes to [place] now on T collar  Review of Systems:  ROS performed and is unremarkable other than noted above.  Past Medical History:  Diagnosis Date  . 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 Aortic mechanical  . NSCLC of left lung ([place]) 05/26/2020  . Personal history of prostate cancer 05/29/2020  . Stroke ([place]) 04/2020  . Tobacco use disorder 05/26/2020    Past Surgical History:  Procedure Laterality Date  . AORTIC VALVE REPAIR      Social History:    reports that he has quit smoking. He has never used smokeless tobacco. No history on file for alcohol use and drug use.  Family History: Non-Contributory to the present illness  Allergies  Allergen Reactions   . Penicillin G Other (See Comments)    Unknown (childhood)  . Lisinopril Other (See Comments)    Varified By [facility] unknown    Medications: Reviewed on Rounds  Physical Exam:  Vitals: Temperature is 96.7 pulse 88 respiratory rate 14 blood pressure is 126/54 saturations 100%  Ventilator Settings on T collar with an FiO2 of 28%  . General: Comfortable at this time . Eyes: Grossly normal lids, irises & conjunctiva . ENT: grossly tongue is normal . Neck: no obvious mass . Cardiovascular: S1-S2 normal no gallop or rub . Respiratory: Scattered diffuse rhonchi noted bilaterally . Abdomen: Soft and nontender . Skin: no rash seen on limited exam . Musculoskeletal: not rigid . Psychiatric:unable to assess . Neurologic: no seizure no involuntary movements         Labs on Admission:  Basic Metabolic Panel: Recent Labs  Lab 12/19/20 0554 12/20/20 0424  NA 131*  --   K 5.2* 5.1  CL 95*  --   CO2 27  --   GLUCOSE 128*  --   BUN 21  --   CREATININE 0.70  --   CALCIUM 9.7  --     No results for input(s): PHART, PCO2ART, PO2ART, HCO3, O2SAT in the last 168 hours.  Liver Function Tests: Recent Labs  Lab 12/19/20 0554  AST 48*  ALT 87*  ALKPHOS 219*  BILITOT 0.7  PROT 6.7  ALBUMIN 2.9*   No results for input(s): LIPASE, AMYLASE in the last 168 hours. No results for input(s): AMMONIA in the last  168 hours.  CBC: Recent Labs  Lab 12/19/20 0554  WBC 19.0*  NEUTROABS 16.1*  HGB 9.7*  HCT 31.9*  MCV 97.0  PLT 520*    Cardiac Enzymes: No results for input(s): CKTOTAL, CKMB, CKMBINDEX, TROPONINI in the last 168 hours.  BNP (last 3 results) No results for input(s): BNP in the last 8760 hours.  ProBNP (last 3 results) No results for input(s): PROBNP in the last 8760 hours.   Radiological Exams on Admission: DG Chest Port 1 View  Result Date: 12/18/2020 CLINICAL DATA:  Respiratory failure, enteric catheter placement EXAM: PORTABLE CHEST 1 VIEW  COMPARISON:  11/08/2020 FINDINGS: Single frontal view of the chest demonstrates tracheostomy tube overlying tracheal air column. Enteric catheter passes below diaphragm tip and side port projecting over gastric fundus. Right internal jugular catheter and right-sided PICC are noted, tips overlying superior vena cava. Cardiac silhouette is stable. Patchy consolidation at the left lung base. Trace left effusion is suspected. No pneumothorax. IMPRESSION: 1. Patchy left basilar consolidation and trace left pleural effusion, which could reflect pneumonia in the appropriate setting. 2. Support devices as above. Electronically Signed   By: Randa Ngo M.D.   On: 12/18/2020 23:32   DG Abd Portable 1V  Result Date: 12/18/2020 CLINICAL DATA:  NG tube.  Respiratory failure EXAM: PORTABLE ABDOMEN - 1 VIEW COMPARISON:  None. FINDINGS: An enteric tube is present with tip in the left upper quadrant consistent with location in the body of the stomach. Scattered gas and stool in the colon. No small or large bowel distention. No radiopaque stones. Catheter projected over the pelvis consistent with a Foley catheter. Vascular calcifications. Degenerative changes in the spine and hips. IMPRESSION: Enteric tube tip is in the left upper quadrant consistent with location in the body of the stomach. Electronically Signed   By: Lucienne Capers M.D.   On: 12/18/2020 23:33    Assessment/Plan Active Problems:   NSCLC of left lung (HCC)   Acute on chronic respiratory failure with hypoxia (HCC)   Acute lower GI bleeding   COVID-19 virus infection   COPD, severe ([place])   1. Acute on chronic respiratory failure with hypoxia patient is actually doing fairly well has a #8 cuffed trach in place we will get a go ahead and change the trach over to a #6 cuffless trach.  Plan is going to be to try to advance the weaning as tolerated from here on 2. GI bleed unspecified we will get a continue to follow the patient's hemoglobin and  hematocrit we will continue with supportive care. 3. COPD supportive care nebulizers as necessary. 4. COVID-19 virus infection by history now in resolution we will continue to monitor closely.  I have personally seen and evaluated the patient, evaluated laboratory and imaging results, formulated the assessment and plan and placed orders. The Patient requires high complexity decision making with multiple systems involvement.  Case was discussed on Rounds with the Respiratory Therapy Director and the Respiratory staff Time Spent 65minutes  Momoko Slezak A Elisia Stepp, MD [facility] Pulmonary Critical Care Medicine Sleep Medicine

## 2020-12-21 DIAGNOSIS — J9621 Acute and chronic respiratory failure with hypoxia: Secondary | ICD-10-CM | POA: Diagnosis not present

## 2020-12-21 DIAGNOSIS — K922 Gastrointestinal hemorrhage, unspecified: Secondary | ICD-10-CM | POA: Diagnosis not present

## 2020-12-21 DIAGNOSIS — U071 COVID-19: Secondary | ICD-10-CM | POA: Diagnosis not present

## 2020-12-21 DIAGNOSIS — J449 Chronic obstructive pulmonary disease, unspecified: Secondary | ICD-10-CM | POA: Diagnosis not present

## 2020-12-21 LAB — PROTIME-INR
INR: 2 — ABNORMAL HIGH (ref 0.8–1.2)
Prothrombin Time: 22.1 seconds — ABNORMAL HIGH (ref 11.4–15.2)

## 2020-12-21 LAB — POTASSIUM
Potassium: 5 mmol/L (ref 3.5–5.1)
Potassium: 4.4 mmol/L (ref 3.5–5.1)

## 2020-12-21 NOTE — Progress Notes (Signed)
Pulmonary Critical Care Medicine [place]   PULMONARY CRITICAL CARE SERVICE  PROGRESS NOTE  Date of Service: 12/21/2020  Jamie Young  MEQ:[id number]  DOB: 1946-02-15   DOA: 12/18/2020  Referring Physician: Merton Border, MD  HPI: Jamie Young is a 75 y.o. male seen for follow up of Acute on Chronic Respiratory Failure.  Patient is back on the ventilator reportedly patient had some type of seizure activity overnight and was placed back on the vent.  Sounds like he may have actually had a mucous plug which dropped his oxygen saturations  Medications: Reviewed on Rounds  Physical Exam:  Vitals: Temperature is 95.8 pulse 81 respiratory rate 16 blood pressure is 140/39 saturations 100%  Ventilator Settings on assist control FiO2 40% tidal volume 500 PEEP 5  . General: Comfortable at this time . Eyes: Grossly normal lids, irises & conjunctiva . ENT: grossly tongue is normal . Neck: no obvious mass . Cardiovascular: S1 S2 normal no gallop . Respiratory: Scattered rhonchi expansion is equal . Abdomen: soft . Skin: no rash seen on limited exam . Musculoskeletal: not rigid . Psychiatric:unable to assess . Neurologic: no seizure no involuntary movements         Lab Data:   Basic Metabolic Panel: Recent Labs  Lab 12/19/20 0554 12/20/20 0424 12/20/20 1755  NA 131*  --  132*  K 5.2* 5.1 6.0*  CL 95*  --  97*  CO2 27  --  23  GLUCOSE 128*  --  135*  BUN 21  --  25*  CREATININE 0.70  --  0.69  CALCIUM 9.7  --  9.8    ABG: Recent Labs  Lab 12/20/20 1733 12/20/20 1853  PHART 7.398 7.367  PCO2ART 43.8 45.4  PO2ART 57.6* 109*  HCO3 26.5 25.5  O2SAT 87.3 97.8    Liver Function Tests: Recent Labs  Lab 12/19/20 0554 12/20/20 1755  AST 48* 62*  ALT 87* 90*  ALKPHOS 219* 221*  BILITOT 0.7 1.0  PROT 6.7 6.9  ALBUMIN 2.9* 3.1*   No results for input(s): LIPASE, AMYLASE in the last 168 hours. No results for input(s): AMMONIA in the last 168  hours.  CBC: Recent Labs  Lab 12/19/20 0554 12/20/20 1755  WBC 19.0* 16.4*  NEUTROABS 16.1*  --   HGB 9.7* 10.7*  HCT 31.9* 34.6*  MCV 97.0 98.0  PLT 520* 441*    Cardiac Enzymes: No results for input(s): CKTOTAL, CKMB, CKMBINDEX, TROPONINI in the last 168 hours.  BNP (last 3 results) No results for input(s): BNP in the last 8760 hours.  ProBNP (last 3 results) No results for input(s): PROBNP in the last 8760 hours.  Radiological Exams: CT HEAD WO CONTRAST  Result Date: 12/20/2020 CLINICAL DATA:  Altered level of consciousness, history of respiratory failure and COVID 19 EXAM: CT HEAD WITHOUT CONTRAST TECHNIQUE: Contiguous axial images were obtained from the base of the skull through the vertex without intravenous contrast. COMPARISON:  11/07/2020, 11/08/2020 FINDINGS: Brain: Stable hypodensities throughout the periventricular white matter and basal ganglia consistent with chronic small vessel ischemic changes. No acute infarct or hemorrhage. Lateral ventricles and remaining midline structures are unremarkable. No acute extra-axial fluid collections. No mass effect. Vascular: No hyperdense vessel or unexpected calcification. Skull: Normal. Negative for fracture or focal lesion. Sinuses/Orbits: No acute finding. Other: None. IMPRESSION: 1. 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 No acute intracranial process. Electronically Signed   By: Diana Eves.D.  On: 12/20/2020 18:37   DG Chest Port 1 View  Result Date: 12/20/2020 CLINICAL DATA:  75 year old male with leukocytosis. EXAM: PORTABLE CHEST 1 VIEW COMPARISON:  Chest radiograph dated 12/18/2020. FINDINGS: Tracheostomy above the carina in similar position. Enteric tube extends below the diaphragm with side-port in the proximal stomach and tip beyond the inferior margin of the image. Right-sided PICC in similar position. Improved aeration of the left lung base compared to prior  radiograph. Small faint bilateral opacities primarily involving the right mid lung field and left lower lung field remain. No pleural effusion or pneumothorax. Stable cardiac silhouette. Median sternotomy wires and CABG vascular clips. Atherosclerotic calcification of the aorta. No acute osseous pathology. IMPRESSION: Improved aeration of the left lung base compared to prior radiograph. Electronically Signed   By: Anner Crete M.D.   On: 12/20/2020 19:52    Assessment/Plan Active Problems:   NSCLC of left lung (HCC)   Acute on chronic respiratory failure with hypoxia (HCC)   Acute lower GI bleeding   COVID-19 virus infection   COPD, severe ([place])   1. Acute on chronic respiratory failure hypoxia plan is to continue with assist control mode for now respiratory therapy is going to assess the RSB and if it is good then we will try to advance the weaning. 2. Non-small cell cancer status post resection. 3. Acute lower GI bleed no change we will continue supportive care 4. COVID-19 virus infection recovery 5. Severe COPD medical management nebulizers as next   I have personally seen and evaluated the patient, evaluated laboratory and imaging results, formulated the assessment and plan and placed orders. The Patient requires high complexity decision making with multiple systems involvement.  Rounds were done with the Respiratory Therapy Director and Staff therapists and discussed with nursing staff also.  Allyne Gee, MD [facility] Pulmonary Critical Care Medicine Sleep Medicine

## 2020-12-22 DIAGNOSIS — J449 Chronic obstructive pulmonary disease, unspecified: Secondary | ICD-10-CM | POA: Diagnosis not present

## 2020-12-22 DIAGNOSIS — U071 COVID-19: Secondary | ICD-10-CM | POA: Diagnosis not present

## 2020-12-22 DIAGNOSIS — J9621 Acute and chronic respiratory failure with hypoxia: Secondary | ICD-10-CM | POA: Diagnosis not present

## 2020-12-22 DIAGNOSIS — K922 Gastrointestinal hemorrhage, unspecified: Secondary | ICD-10-CM | POA: Diagnosis not present

## 2020-12-22 LAB — PROTIME-INR
INR: 2.2 — ABNORMAL HIGH (ref 0.8–1.2)
Prothrombin Time: 23.4 seconds — ABNORMAL HIGH (ref 11.4–15.2)

## 2020-12-22 LAB — CBC
HCT: 30.3 % — ABNORMAL LOW (ref 39.0–52.0)
Hemoglobin: 9.5 g/dL — ABNORMAL LOW (ref 13.0–17.0)
MCH: 30.2 pg (ref 26.0–34.0)
MCHC: 31.4 g/dL (ref 30.0–36.0)
MCV: 96.2 fL (ref 80.0–100.0)
Platelets: 437 10*3/uL — ABNORMAL HIGH (ref 150–400)
RBC: 3.15 MIL/uL — ABNORMAL LOW (ref 4.22–5.81)
RDW: 18.3 % — ABNORMAL HIGH (ref 11.5–15.5)
WBC: 14 10*3/uL — ABNORMAL HIGH (ref 4.0–10.5)
nRBC: 0 % (ref 0.0–0.2)

## 2020-12-22 LAB — BASIC METABOLIC PANEL
Anion gap: 10 (ref 5–15)
BUN: 36 mg/dL — ABNORMAL HIGH (ref 8–23)
CO2: 27 mmol/L (ref 22–32)
Calcium: 9.7 mg/dL (ref 8.9–10.3)
Chloride: 96 mmol/L — ABNORMAL LOW (ref 98–111)
Creatinine, Ser: 0.68 mg/dL (ref 0.61–1.24)
GFR, Estimated: >60 mL/min (ref >60)
Glucose, Bld: 119 mg/dL — ABNORMAL HIGH (ref 70–99)
Potassium: 4.7 mmol/L (ref 3.5–5.1)
Sodium: 133 mmol/L — ABNORMAL LOW (ref 135–145)

## 2020-12-22 LAB — MAGNESIUM: Magnesium: 2 mg/dL (ref 1.7–2.4)

## 2020-12-22 NOTE — Progress Notes (Signed)
Pulmonary Critical Care Medicine [place]   PULMONARY CRITICAL CARE SERVICE  PROGRESS NOTE  Date of Service: 12/22/2020  Jamie HUGHETT  PFX:[id number]  DOB: 09-26-1946   DOA: 12/18/2020  Referring Physician: Merton Border, MD  HPI: Jamie Young is a 75 y.o. male seen for follow up of Acute on Chronic Respiratory Failure.  He is off the ventilator on T collar currently on 40% FiO2 noted to have some increase in secretions which are being requiring aggressive suctioning.  Medications: Reviewed on Rounds  Physical Exam:  Vitals: Temperature is 96.9 pulse 85 respiratory 20 blood pressure is 122/68 saturations 100%  Ventilator Settings on T collar FiO2 40%  . General: Comfortable at this time . Eyes: Grossly normal lids, irises & conjunctiva . ENT: grossly tongue is normal . Neck: no obvious mass . Cardiovascular: S1 S2 normal no gallop . Respiratory: No rhonchi coarse breath . Abdomen: soft . Skin: no rash seen on limited exam . Musculoskeletal: not rigid . Psychiatric:unable to assess . Neurologic: no seizure no involuntary movements         Lab Data:   Basic Metabolic Panel: Recent Labs  Lab 12/19/20 0554 12/20/20 0424 12/20/20 1755 12/21/20 0904 12/21/20 1548 12/22/20 0525  NA 131*  --  132*  --   --  133*  K 5.2* 5.1 6.0* 5.0 4.4 4.7  CL 95*  --  97*  --   --  96*  CO2 27  --  23  --   --  27  GLUCOSE 128*  --  135*  --   --  119*  BUN 21  --  25*  --   --  36*  CREATININE 0.70  --  0.69  --   --  0.68  CALCIUM 9.7  --  9.8  --   --  9.7  MG  --   --   --   --   --  2.0    ABG: Recent Labs  Lab 12/20/20 1733 12/20/20 1853  PHART 7.398 7.367  PCO2ART 43.8 45.4  PO2ART 57.6* 109*  HCO3 26.5 25.5  O2SAT 87.3 97.8    Liver Function Tests: Recent Labs  Lab 12/19/20 0554 12/20/20 1755  AST 48* 62*  ALT 87* 90*  ALKPHOS 219* 221*  BILITOT 0.7 1.0  PROT 6.7 6.9  ALBUMIN 2.9* 3.1*   No results for input(s): LIPASE,  AMYLASE in the last 168 hours. No results for input(s): AMMONIA in the last 168 hours.  CBC: Recent Labs  Lab 12/19/20 0554 12/20/20 1755 12/22/20 0525  WBC 19.0* 16.4* 14.0*  NEUTROABS 16.1*  --   --   HGB 9.7* 10.7* 9.5*  HCT 31.9* 34.6* 30.3*  MCV 97.0 98.0 96.2  PLT 520* 441* 437*    Cardiac Enzymes: No results for input(s): CKTOTAL, CKMB, CKMBINDEX, TROPONINI in the last 168 hours.  BNP (last 3 results) No results for input(s): BNP in the last 8760 hours.  ProBNP (last 3 results) No results for input(s): PROBNP in the last 8760 hours.  Radiological Exams: CT HEAD WO CONTRAST  Result Date: 12/20/2020 CLINICAL DATA:  Altered level of consciousness, history of respiratory failure and COVID 19 EXAM: CT HEAD WITHOUT CONTRAST TECHNIQUE: Contiguous axial images were obtained from the base of the skull through the vertex without intravenous contrast. COMPARISON:  11/07/2020, 11/08/2020 FINDINGS: Brain: Stable hypodensities throughout the periventricular white matter and basal ganglia consistent with chronic small vessel ischemic changes. No acute infarct  or hemorrhage. Lateral ventricles and remaining midline structures are unremarkable. No acute extra-axial fluid collections. No mass effect. Vascular: No hyperdense vessel or unexpected calcification. Skull: Normal. Negative for fracture or focal lesion. Sinuses/Orbits: No acute finding. Other: None. IMPRESSION: 1. 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 No acute intracranial process. Electronically Signed   By: Randa Ngo M.D.   On: 12/20/2020 18:37   DG Chest Port 1 View  Result Date: 12/20/2020 CLINICAL DATA:  75 year old male with leukocytosis. EXAM: PORTABLE CHEST 1 VIEW COMPARISON:  Chest radiograph dated 12/18/2020. FINDINGS: Tracheostomy above the carina in similar position. Enteric tube extends below the diaphragm with side-port in the proximal stomach and tip beyond the inferior  margin of the image. Right-sided PICC in similar position. Improved aeration of the left lung base compared to prior radiograph. Small faint bilateral opacities primarily involving the right mid lung field and left lower lung field remain. No pleural effusion or pneumothorax. Stable cardiac silhouette. Median sternotomy wires and CABG vascular clips. Atherosclerotic calcification of the aorta. No acute osseous pathology. IMPRESSION: Improved aeration of the left lung base compared to prior radiograph. Electronically Signed   By: Anner Crete M.D.   On: 12/20/2020 19:52    Assessment/Plan Active Problems:   NSCLC of left lung (HCC)   Acute on chronic respiratory failure with hypoxia (HCC)   Acute lower GI bleeding   COVID-19 virus infection   COPD, severe ([place])   1. Acute on chronic respiratory failure hypoxia we will continue T collar trials titrate oxygen as tolerated continue pulmonary toilet. 2. Non-small cell cancer of the lung supportive care 3. COVID-19 virus infection recovery 4. Severe COPD medical management 5. GI bleed no active bleeding   I have personally seen and evaluated the patient, evaluated laboratory and imaging results, formulated the assessment and plan and placed orders. The Patient requires high complexity decision making with multiple systems involvement.  Rounds were done with the Respiratory Therapy Director and Staff therapists and discussed with nursing staff also.  Allyne Gee, MD [facility] Pulmonary Critical Care Medicine Sleep Medicine

## 2020-12-23 LAB — PROTIME-INR
INR: 2.6 — ABNORMAL HIGH (ref 0.8–1.2)
Prothrombin Time: 26.7 seconds — ABNORMAL HIGH (ref 11.4–15.2)

## 2020-12-24 DIAGNOSIS — K922 Gastrointestinal hemorrhage, unspecified: Secondary | ICD-10-CM | POA: Diagnosis not present

## 2020-12-24 DIAGNOSIS — J449 Chronic obstructive pulmonary disease, unspecified: Secondary | ICD-10-CM | POA: Diagnosis not present

## 2020-12-24 DIAGNOSIS — U071 COVID-19: Secondary | ICD-10-CM | POA: Diagnosis not present

## 2020-12-24 DIAGNOSIS — J9621 Acute and chronic respiratory failure with hypoxia: Secondary | ICD-10-CM | POA: Diagnosis not present

## 2020-12-24 LAB — PROTIME-INR
INR: 2.6 — ABNORMAL HIGH (ref 0.8–1.2)
Prothrombin Time: 27.3 seconds — ABNORMAL HIGH (ref 11.4–15.2)

## 2020-12-24 NOTE — Progress Notes (Signed)
Pulmonary Critical Care Medicine [place]   PULMONARY CRITICAL CARE SERVICE  PROGRESS NOTE  Date of Service: 12/24/2020  Jamie Young  [id number]  DOB: 1946/03/24   DOA: 12/18/2020  Referring Physician: Merton Border, MD  HPI: Jamie Young is a 75 y.o. male seen for follow up of Acute on Chronic Respiratory Failure.  Patient currently is on T collar has been on 35% FiO2  Medications: Reviewed on Rounds  Physical Exam:  Vitals: Temperature is 96.3 pulse 75 respiratory rate 17 blood pressure is 107/47 saturations 100%  Ventilator Settings off the ventilator on T collar FiO2 of 35%  . General: Comfortable at this time . Eyes: Grossly normal lids, irises & conjunctiva . ENT: grossly tongue is normal . Neck: no obvious mass . Cardiovascular: S1 S2 normal no gallop . Respiratory: Coarse rhonchi expansion is equal . Abdomen: soft . Skin: no rash seen on limited exam . Musculoskeletal: not rigid . Psychiatric:unable to assess . Neurologic: no seizure no involuntary movements         Lab Data:   Basic Metabolic Panel: Recent Labs  Lab 12/19/20 0554 12/20/20 0424 12/20/20 1755 12/21/20 0904 12/21/20 1548 12/22/20 0525  NA 131*  --  132*  --   --  133*  K 5.2* 5.1 6.0* 5.0 4.4 4.7  CL 95*  --  97*  --   --  96*  CO2 27  --  23  --   --  27  GLUCOSE 128*  --  135*  --   --  119*  BUN 21  --  25*  --   --  36*  CREATININE 0.70  --  0.69  --   --  0.68  CALCIUM 9.7  --  9.8  --   --  9.7  MG  --   --   --   --   --  2.0    ABG: Recent Labs  Lab 12/20/20 1733 12/20/20 1853  PHART 7.398 7.367  PCO2ART 43.8 45.4  PO2ART 57.6* 109*  HCO3 26.5 25.5  O2SAT 87.3 97.8    Liver Function Tests: Recent Labs  Lab 12/19/20 0554 12/20/20 1755  AST 48* 62*  ALT 87* 90*  ALKPHOS 219* 221*  BILITOT 0.7 1.0  PROT 6.7 6.9  ALBUMIN 2.9* 3.1*   No results for input(s): LIPASE, AMYLASE in the last 168 hours. No results for input(s): AMMONIA  in the last 168 hours.  CBC: Recent Labs  Lab 12/19/20 0554 12/20/20 1755 12/22/20 0525  WBC 19.0* 16.4* 14.0*  NEUTROABS 16.1*  --   --   HGB 9.7* 10.7* 9.5*  HCT 31.9* 34.6* 30.3*  MCV 97.0 98.0 96.2  PLT 520* 441* 437*    Cardiac Enzymes: No results for input(s): CKTOTAL, CKMB, CKMBINDEX, TROPONINI in the last 168 hours.  BNP (last 3 results) No results for input(s): BNP in the last 8760 hours.  ProBNP (last 3 results) No results for input(s): PROBNP in the last 8760 hours.  Radiological Exams: No results found.  Assessment/Plan Active Problems:   NSCLC of left lung (HCC)   Acute on chronic respiratory failure with hypoxia (HCC)   Acute lower GI bleeding   COVID-19 virus infection   COPD, severe ([place])   1. Acute on chronic respiratory failure with hypoxia we will continue with T collar trials patient is on 35% FiO2. 2. Acute lower GI bleed no sign of active bleeding at this time 3. Non-small cell cancer  status post resection 4. COVID-19 virus infection recovery 5. Severe COPD medical management   I have personally seen and evaluated the patient, evaluated laboratory and imaging results, formulated the assessment and plan and placed orders. The Patient requires high complexity decision making with multiple systems involvement.  Rounds were done with the Respiratory Therapy Director and Staff therapists and discussed with nursing staff also.  Allyne Gee, MD [facility] Pulmonary Critical Care Medicine Sleep Medicine

## 2020-12-25 ENCOUNTER — Other Ambulatory Visit (HOSPITAL_COMMUNITY): Payer: No Typology Code available for payment source

## 2020-12-25 DIAGNOSIS — K922 Gastrointestinal hemorrhage, unspecified: Secondary | ICD-10-CM | POA: Diagnosis not present

## 2020-12-25 DIAGNOSIS — U071 COVID-19: Secondary | ICD-10-CM | POA: Diagnosis not present

## 2020-12-25 DIAGNOSIS — J9621 Acute and chronic respiratory failure with hypoxia: Secondary | ICD-10-CM | POA: Diagnosis not present

## 2020-12-25 DIAGNOSIS — J449 Chronic obstructive pulmonary disease, unspecified: Secondary | ICD-10-CM | POA: Diagnosis not present

## 2020-12-25 LAB — PROTIME-INR
INR: 2.5 — ABNORMAL HIGH (ref 0.8–1.2)
Prothrombin Time: 26.2 seconds — ABNORMAL HIGH (ref 11.4–15.2)

## 2020-12-25 LAB — BASIC METABOLIC PANEL
Anion gap: 10 (ref 5–15)
BUN: 34 mg/dL — ABNORMAL HIGH (ref 8–23)
CO2: 28 mmol/L (ref 22–32)
Calcium: 10 mg/dL (ref 8.9–10.3)
Chloride: 98 mmol/L (ref 98–111)
Creatinine, Ser: 0.5 mg/dL — ABNORMAL LOW (ref 0.61–1.24)
GFR, Estimated: >60 mL/min (ref >60)
Glucose, Bld: 131 mg/dL — ABNORMAL HIGH (ref 70–99)
Potassium: 5.5 mmol/L — ABNORMAL HIGH (ref 3.5–5.1)
Sodium: 136 mmol/L (ref 135–145)

## 2020-12-25 LAB — CBC
HCT: 35.4 % — ABNORMAL LOW (ref 39.0–52.0)
Hemoglobin: 10.4 g/dL — ABNORMAL LOW (ref 13.0–17.0)
MCH: 29.2 pg (ref 26.0–34.0)
MCHC: 29.4 g/dL — ABNORMAL LOW (ref 30.0–36.0)
MCV: 99.4 fL (ref 80.0–100.0)
Platelets: 388 10*3/uL (ref 150–400)
RBC: 3.56 MIL/uL — ABNORMAL LOW (ref 4.22–5.81)
RDW: 17.7 % — ABNORMAL HIGH (ref 11.5–15.5)
WBC: 10.3 10*3/uL (ref 4.0–10.5)
nRBC: 0 % (ref 0.0–0.2)

## 2020-12-25 NOTE — Progress Notes (Signed)
Pulmonary Critical Care Medicine [place]   PULMONARY CRITICAL CARE SERVICE  PROGRESS NOTE  Date of Service: 12/25/2020  Jamie Young  ZOX:[id number]  DOB: 18-Nov-1946   DOA: 12/18/2020  Referring Physician: Merton Border, MD  HPI: Jamie Young is a 75 y.o. male seen for follow up of Acute on Chronic Respiratory Failure.  Patient is on T collar on 20% FiO2  Medications: Reviewed on Rounds  Physical Exam:  Vitals: Temperature is 97.8 pulse 83 respiratory 24 blood pressure is 126/74 saturations 96%  Ventilator Settings on T collar with an FiO2 28%  . General: Comfortable at this time . Eyes: Grossly normal lids, irises & conjunctiva . ENT: grossly tongue is normal . Neck: no obvious mass . Cardiovascular: S1 S2 normal no gallop . Respiratory: Coarse breath sounds with few scattered . Abdomen: soft . Skin: no rash seen on limited exam . Musculoskeletal: not rigid . Psychiatric:unable to assess . Neurologic: no seizure no involuntary movements         Lab Data:   Basic Metabolic Panel: Recent Labs  Lab 12/19/20 0554 12/20/20 0424 12/20/20 1755 12/21/20 0904 12/21/20 1548 12/22/20 0525 12/25/20 0647  NA 131*  --  132*  --   --  133* 136  K 5.2*   < > 6.0* 5.0 4.4 4.7 5.5*  CL 95*  --  97*  --   --  96* 98  CO2 27  --  23  --   --  27 28  GLUCOSE 128*  --  135*  --   --  119* 131*  BUN 21  --  25*  --   --  36* 34*  CREATININE 0.70  --  0.69  --   --  0.68 0.50*  CALCIUM 9.7  --  9.8  --   --  9.7 10.0  MG  --   --   --   --   --  2.0  --    < > = values in this interval not displayed.    ABG: Recent Labs  Lab 12/20/20 1733 12/20/20 1853  PHART 7.398 7.367  PCO2ART 43.8 45.4  PO2ART 57.6* 109*  HCO3 26.5 25.5  O2SAT 87.3 97.8    Liver Function Tests: Recent Labs  Lab 12/19/20 0554 12/20/20 1755  AST 48* 62*  ALT 87* 90*  ALKPHOS 219* 221*  BILITOT 0.7 1.0  PROT 6.7 6.9  ALBUMIN 2.9* 3.1*   No results for input(s):  LIPASE, AMYLASE in the last 168 hours. No results for input(s): AMMONIA in the last 168 hours.  CBC: Recent Labs  Lab 12/19/20 0554 12/20/20 1755 12/22/20 0525 12/25/20 0647  WBC 19.0* 16.4* 14.0* 10.3  NEUTROABS 16.1*  --   --   --   HGB 9.7* 10.7* 9.5* 10.4*  HCT 31.9* 34.6* 30.3* 35.4*  MCV 97.0 98.0 96.2 99.4  PLT 520* 441* 437* 388    Cardiac Enzymes: No results for input(s): CKTOTAL, CKMB, CKMBINDEX, TROPONINI in the last 168 hours.  BNP (last 3 results) No results for input(s): BNP in the last 8760 hours.  ProBNP (last 3 results) No results for input(s): PROBNP in the last 8760 hours.  Radiological Exams: DG CHEST PORT 1 VIEW  Result Date: 12/25/2020 CLINICAL DATA:  75 year old male respiratory failure. History of COVID-19. EXAM: PORTABLE CHEST 1 VIEW COMPARISON:  Portable chest 12/20/2020 and earlier. FINDINGS: Portable AP semi upright view at 0543 hours. Stable tracheostomy tube. Stable enteric tube and  right PICC line. Pulmonary hyperinflation. Stable lung volumes. Mediastinal contours remain within normal limits, prior sternotomy. Diffusely increased pulmonary interstitium is approaching baseline compared to 11/06/2020 radiographs, but remains diffusely increased with basilar predominant reticulonodular changes greater on the left. No pneumothorax. No definite pleural effusion. No new pulmonary opacity. Stable visualized osseous structures. IMPRESSION: 1. Chronic lung disease with acute on chronic reticulonodular density most pronounced at the left lung base. Favor related to viral respiratory infection. 2. No new cardiopulmonary abnormality. 3.  Stable lines and tubes. Electronically Signed   By: Genevie Ann M.D.   On: 12/25/2020 06:03    Assessment/Plan Active Problems:   NSCLC of left lung (HCC)   Acute on chronic respiratory failure with hypoxia (HCC)   Acute lower GI bleeding   COVID-19 virus infection   COPD, severe ([place])   1. Acute on chronic respiratory  failure hypoxia we will continue with T collar trials continue secretion management supportive care. 2. Non-small cell cancer of the lung supportive care 3. Acute GI bleed no active bleeding 4. COVID-19 virus infection recovery 5. Severe COPD at baseline   I have personally seen and evaluated the patient, evaluated laboratory and imaging results, formulated the assessment and plan and placed orders. The Patient requires high complexity decision making with multiple systems involvement.  Rounds were done with the Respiratory Therapy Director and Staff therapists and discussed with nursing staff also.  Allyne Gee, MD [facility] Pulmonary Critical Care Medicine Sleep Medicine

## 2020-12-26 DIAGNOSIS — U071 COVID-19: Secondary | ICD-10-CM | POA: Diagnosis not present

## 2020-12-26 DIAGNOSIS — J9621 Acute and chronic respiratory failure with hypoxia: Secondary | ICD-10-CM | POA: Diagnosis not present

## 2020-12-26 DIAGNOSIS — K922 Gastrointestinal hemorrhage, unspecified: Secondary | ICD-10-CM | POA: Diagnosis not present

## 2020-12-26 DIAGNOSIS — J449 Chronic obstructive pulmonary disease, unspecified: Secondary | ICD-10-CM | POA: Diagnosis not present

## 2020-12-26 LAB — PROTIME-INR
INR: 2.3 — ABNORMAL HIGH (ref 0.8–1.2)
Prothrombin Time: 24.6 seconds — ABNORMAL HIGH (ref 11.4–15.2)

## 2020-12-26 LAB — POTASSIUM: Potassium: 4.3 mmol/L (ref 3.5–5.1)

## 2020-12-26 NOTE — Progress Notes (Signed)
Pulmonary Critical Care Medicine [place]   PULMONARY CRITICAL CARE SERVICE  PROGRESS NOTE  Date of Service: 12/26/2020  Jamie Young  RJJ:[id number]  DOB: 19-Jan-1946   DOA: 12/18/2020  Referring Physician: Merton Border, MD  HPI: Jamie Young is a 75 y.o. male seen for follow up of Acute on Chronic Respiratory Failure.  Patient is off the ventilator right now has been on T collar on 28% FiO2  Medications: Reviewed on Rounds  Physical Exam:  Vitals: Temperature is 97.4 pulse 75 respiratory 14 blood pressure is 115/62 saturations 100%  Ventilator Settings on T collar FiO2 28%  . General: Comfortable at this time . Eyes: Grossly normal lids, irises & conjunctiva . ENT: grossly tongue is normal . Neck: no obvious mass . Cardiovascular: S1 S2 normal no gallop . Respiratory: Scattered rhonchi expansion is equal . Abdomen: soft . Skin: no rash seen on limited exam . Musculoskeletal: not rigid . Psychiatric:unable to assess . Neurologic: no seizure no involuntary movements         Lab Data:   Basic Metabolic Panel: Recent Labs  Lab 12/20/20 1755 12/21/20 0904 12/21/20 1548 12/22/20 0525 12/25/20 0647 12/26/20 0901  NA 132*  --   --  133* 136  --   K 6.0* 5.0 4.4 4.7 5.5* 4.3  CL 97*  --   --  96* 98  --   CO2 23  --   --  27 28  --   GLUCOSE 135*  --   --  119* 131*  --   BUN 25*  --   --  36* 34*  --   CREATININE 0.69  --   --  0.68 0.50*  --   CALCIUM 9.8  --   --  9.7 10.0  --   MG  --   --   --  2.0  --   --     ABG: Recent Labs  Lab 12/20/20 1733 12/20/20 1853  PHART 7.398 7.367  PCO2ART 43.8 45.4  PO2ART 57.6* 109*  HCO3 26.5 25.5  O2SAT 87.3 97.8    Liver Function Tests: Recent Labs  Lab 12/20/20 1755  AST 62*  ALT 90*  ALKPHOS 221*  BILITOT 1.0  PROT 6.9  ALBUMIN 3.1*   No results for input(s): LIPASE, AMYLASE in the last 168 hours. No results for input(s): AMMONIA in the last 168 hours.  CBC: Recent Labs   Lab 12/20/20 1755 12/22/20 0525 12/25/20 0647  WBC 16.4* 14.0* 10.3  HGB 10.7* 9.5* 10.4*  HCT 34.6* 30.3* 35.4*  MCV 98.0 96.2 99.4  PLT 441* 437* 388    Cardiac Enzymes: No results for input(s): CKTOTAL, CKMB, CKMBINDEX, TROPONINI in the last 168 hours.  BNP (last 3 results) No results for input(s): BNP in the last 8760 hours.  ProBNP (last 3 results) No results for input(s): PROBNP in the last 8760 hours.  Radiological Exams: DG CHEST PORT 1 VIEW  Result Date: 12/25/2020 CLINICAL DATA:  75 year old male respiratory failure. History of COVID-19. EXAM: PORTABLE CHEST 1 VIEW COMPARISON:  Portable chest 12/20/2020 and earlier. FINDINGS: Portable AP semi upright view at 0543 hours. Stable tracheostomy tube. Stable enteric tube and right PICC line. Pulmonary hyperinflation. Stable lung volumes. Mediastinal contours remain within normal limits, prior sternotomy. Diffusely increased pulmonary interstitium is approaching baseline compared to 11/06/2020 radiographs, but remains diffusely increased with basilar predominant reticulonodular changes greater on the left. No pneumothorax. No definite pleural effusion. No new pulmonary opacity.  Stable visualized osseous structures. IMPRESSION: 1. Chronic lung disease with acute on chronic reticulonodular density most pronounced at the left lung base. Favor related to viral respiratory infection. 2. No new cardiopulmonary abnormality. 3.  Stable lines and tubes. Electronically Signed   By: Genevie Ann M.D.   On: 12/25/2020 06:03    Assessment/Plan Active Problems:   NSCLC of left lung (HCC)   Acute on chronic respiratory failure with hypoxia (HCC)   Acute lower GI bleeding   COVID-19 virus infection   COPD, severe ([place])   1. Acute on chronic respiratory failure hypoxia patient is currently on T collar on 20% FiO2 good saturations. 2. Acute lower GI bleed no change we will continue to follow 3. COVID-19 virus infection recovery 4. Severe COPD at  baseline 5. Non-small cell cancer of the lung supportive care   I have personally seen and evaluated the patient, evaluated laboratory and imaging results, formulated the assessment and plan and placed orders. The Patient requires high complexity decision making with multiple systems involvement.  Rounds were done with the Respiratory Therapy Director and Staff therapists and discussed with nursing staff also.  Jamie Gee, MD [facility] Pulmonary Critical Care Medicine Sleep Medicine

## 2020-12-27 DIAGNOSIS — U071 COVID-19: Secondary | ICD-10-CM | POA: Diagnosis not present

## 2020-12-27 DIAGNOSIS — J9621 Acute and chronic respiratory failure with hypoxia: Secondary | ICD-10-CM | POA: Diagnosis not present

## 2020-12-27 DIAGNOSIS — K922 Gastrointestinal hemorrhage, unspecified: Secondary | ICD-10-CM | POA: Diagnosis not present

## 2020-12-27 DIAGNOSIS — J449 Chronic obstructive pulmonary disease, unspecified: Secondary | ICD-10-CM | POA: Diagnosis not present

## 2020-12-27 LAB — BASIC METABOLIC PANEL
Anion gap: 8 (ref 5–15)
BUN: 35 mg/dL — ABNORMAL HIGH (ref 8–23)
CO2: 31 mmol/L (ref 22–32)
Calcium: 10.1 mg/dL (ref 8.9–10.3)
Chloride: 102 mmol/L (ref 98–111)
Creatinine, Ser: 0.44 mg/dL — ABNORMAL LOW (ref 0.61–1.24)
GFR, Estimated: >60 mL/min (ref >60)
Glucose, Bld: 129 mg/dL — ABNORMAL HIGH (ref 70–99)
Potassium: 4.1 mmol/L (ref 3.5–5.1)
Sodium: 141 mmol/L (ref 135–145)

## 2020-12-27 LAB — PROTIME-INR
INR: 2.4 — ABNORMAL HIGH (ref 0.8–1.2)
Prothrombin Time: 25.5 seconds — ABNORMAL HIGH (ref 11.4–15.2)

## 2020-12-27 NOTE — Progress Notes (Signed)
Pulmonary Critical Care Medicine [place]   PULMONARY CRITICAL CARE SERVICE  PROGRESS NOTE  Date of Service: 12/27/2020  Jamie Young  IRJ:[id number]  DOB: Nov 21, 1946   DOA: 12/18/2020  Referring Physician: Merton Border, MD  HPI: Jamie Young is a 75 y.o. male seen for follow up of Acute on Chronic Respiratory Failure.  Patient currently is on T collar using the PMV appears to be doing well  Medications: Reviewed on Rounds  Physical Exam:  Vitals: Temperature 97.4 pulse 85 respiratory rate is 18 blood pressure is 136/75 saturations 99%  Ventilator Settings on T collar with an FiO2 of 28%  . General: Comfortable at this time . Eyes: Grossly normal lids, irises & conjunctiva . ENT: grossly tongue is normal . Neck: no obvious mass . Cardiovascular: S1 S2 normal no gallop . Respiratory: No rhonchi coarse breath sound . Abdomen: soft . Skin: no rash seen on limited exam . Musculoskeletal: not rigid . Psychiatric:unable to assess . Neurologic: no seizure no involuntary movements         Lab Data:   Basic Metabolic Panel: Recent Labs  Lab 12/20/20 1755 12/21/20 0904 12/21/20 1548 12/22/20 0525 12/25/20 0647 12/26/20 0901 12/27/20 0433  NA 132*  --   --  133* 136  --  141  K 6.0*   < > 4.4 4.7 5.5* 4.3 4.1  CL 97*  --   --  96* 98  --  102  CO2 23  --   --  27 28  --  31  GLUCOSE 135*  --   --  119* 131*  --  129*  BUN 25*  --   --  36* 34*  --  35*  CREATININE 0.69  --   --  0.68 0.50*  --  0.44*  CALCIUM 9.8  --   --  9.7 10.0  --  10.1  MG  --   --   --  2.0  --   --   --    < > = values in this interval not displayed.    ABG: Recent Labs  Lab 12/20/20 1733 12/20/20 1853  PHART 7.398 7.367  PCO2ART 43.8 45.4  PO2ART 57.6* 109*  HCO3 26.5 25.5  O2SAT 87.3 97.8    Liver Function Tests: Recent Labs  Lab 12/20/20 1755  AST 62*  ALT 90*  ALKPHOS 221*  BILITOT 1.0  PROT 6.9  ALBUMIN 3.1*   No results for input(s):  LIPASE, AMYLASE in the last 168 hours. No results for input(s): AMMONIA in the last 168 hours.  CBC: Recent Labs  Lab 12/20/20 1755 12/22/20 0525 12/25/20 0647  WBC 16.4* 14.0* 10.3  HGB 10.7* 9.5* 10.4*  HCT 34.6* 30.3* 35.4*  MCV 98.0 96.2 99.4  PLT 441* 437* 388    Cardiac Enzymes: No results for input(s): CKTOTAL, CKMB, CKMBINDEX, TROPONINI in the last 168 hours.  BNP (last 3 results) No results for input(s): BNP in the last 8760 hours.  ProBNP (last 3 results) No results for input(s): PROBNP in the last 8760 hours.  Radiological Exams: No results found.  Assessment/Plan Active Problems:   NSCLC of left lung (HCC)   Acute on chronic respiratory failure with hypoxia (HCC)   Acute lower GI bleeding   COVID-19 virus infection   COPD, severe ([place])   1. Acute on chronic respiratory failure hypoxia we will continue with the T collar titrate oxygen continue pulmonary toilet. 2. Acute lower GI bleed patient  is at baseline 3. COVID-19 virus infection recovery 4. Severe COPD no change 5. 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 The Patient requires high complexity decision making with multiple systems involvement.  Rounds were done with the Respiratory Therapy Director and Staff therapists and discussed with nursing staff also.  Allyne Gee, MD [facility] Pulmonary Critical Care Medicine Sleep Medicine

## 2020-12-28 DIAGNOSIS — J9621 Acute and chronic respiratory failure with hypoxia: Secondary | ICD-10-CM | POA: Diagnosis not present

## 2020-12-28 DIAGNOSIS — K922 Gastrointestinal hemorrhage, unspecified: Secondary | ICD-10-CM | POA: Diagnosis not present

## 2020-12-28 DIAGNOSIS — U071 COVID-19: Secondary | ICD-10-CM | POA: Diagnosis not present

## 2020-12-28 DIAGNOSIS — J449 Chronic obstructive pulmonary disease, unspecified: Secondary | ICD-10-CM | POA: Diagnosis not present

## 2020-12-28 LAB — PROTIME-INR
INR: 2.2 — ABNORMAL HIGH (ref 0.8–1.2)
Prothrombin Time: 23.4 seconds — ABNORMAL HIGH (ref 11.4–15.2)

## 2020-12-28 NOTE — Progress Notes (Signed)
Pulmonary Critical Care Medicine [place]   PULMONARY CRITICAL CARE SERVICE  PROGRESS NOTE  Date of Service: 12/28/2020  Jamie Young  WPY:[id number]  DOB: 10-Jul-1946   DOA: 12/18/2020  Referring Physician: Merton Border, MD  HPI: Jamie Young is a 75 y.o. male seen for follow up of Acute on Chronic Respiratory Failure.  Patient currently is on T collar has been on 28% FiO2  Medications: Reviewed on Rounds  Physical Exam:  Vitals: Temperature is 96.2 pulse 78 respiratory 18 blood pressure is 153/74 saturations 100%  Ventilator Settings on T collar with an FiO2 of 28%  . General: Comfortable at this time . Eyes: Grossly normal lids, irises & conjunctiva . ENT: grossly tongue is normal . Neck: no obvious mass . Cardiovascular: S1 S2 normal no gallop . Respiratory: Scattered rhonchi expansion is equal . Abdomen: soft . Skin: no rash seen on limited exam . Musculoskeletal: not rigid . Psychiatric:unable to assess . Neurologic: no seizure no involuntary movements         Lab Data:   Basic Metabolic Panel: Recent Labs  Lab 12/21/20 1548 12/22/20 0525 12/25/20 0647 12/26/20 0901 12/27/20 0433  NA  --  133* 136  --  141  K 4.4 4.7 5.5* 4.3 4.1  CL  --  96* 98  --  102  CO2  --  27 28  --  31  GLUCOSE  --  119* 131*  --  129*  BUN  --  36* 34*  --  35*  CREATININE  --  0.68 0.50*  --  0.44*  CALCIUM  --  9.7 10.0  --  10.1  MG  --  2.0  --   --   --     ABG: No results for input(s): PHART, PCO2ART, PO2ART, HCO3, O2SAT in the last 168 hours.  Liver Function Tests: No results for input(s): AST, ALT, ALKPHOS, BILITOT, PROT, ALBUMIN in the last 168 hours. No results for input(s): LIPASE, AMYLASE in the last 168 hours. No results for input(s): AMMONIA in the last 168 hours.  CBC: Recent Labs  Lab 12/22/20 0525 12/25/20 0647  WBC 14.0* 10.3  HGB 9.5* 10.4*  HCT 30.3* 35.4*  MCV 96.2 99.4  PLT 437* 388    Cardiac Enzymes: No  results for input(s): CKTOTAL, CKMB, CKMBINDEX, TROPONINI in the last 168 hours.  BNP (last 3 results) No results for input(s): BNP in the last 8760 hours.  ProBNP (last 3 results) No results for input(s): PROBNP in the last 8760 hours.  Radiological Exams: No results found.  Assessment/Plan Active Problems:   NSCLC of left lung (HCC)   Acute on chronic respiratory failure with hypoxia (HCC)   Acute lower GI bleeding   COVID-19 virus infection   COPD, severe ([place])   1. Acute on chronic respiratory failure hypoxia we will continue with T collar titrate oxygen continue pulmonary toilet.  Today we will try capping 2. Non-small cell cancer of the lung we will continue to follow 3. Acute lower GI bleed at baseline 4. COVID-19 virus infection recovery 5. Severe COPD we will continue with medical management   I have personally seen and evaluated the patient, evaluated laboratory and imaging results, formulated the assessment and plan and placed orders. The Patient requires high complexity decision making with multiple systems involvement.  Rounds were done with the Respiratory Therapy Director and Staff therapists and discussed with nursing staff also.  Allyne Gee, MD [facility] Pulmonary Critical  Care Medicine Sleep Medicine

## 2020-12-29 ENCOUNTER — Other Ambulatory Visit (HOSPITAL_COMMUNITY): Payer: No Typology Code available for payment source

## 2020-12-29 DIAGNOSIS — U071 COVID-19: Secondary | ICD-10-CM | POA: Diagnosis not present

## 2020-12-29 DIAGNOSIS — J449 Chronic obstructive pulmonary disease, unspecified: Secondary | ICD-10-CM | POA: Diagnosis not present

## 2020-12-29 DIAGNOSIS — K922 Gastrointestinal hemorrhage, unspecified: Secondary | ICD-10-CM | POA: Diagnosis not present

## 2020-12-29 DIAGNOSIS — J9621 Acute and chronic respiratory failure with hypoxia: Secondary | ICD-10-CM | POA: Diagnosis not present

## 2020-12-29 LAB — PROTIME-INR
INR: 2.1 — ABNORMAL HIGH (ref 0.8–1.2)
Prothrombin Time: 23 seconds — ABNORMAL HIGH (ref 11.4–15.2)

## 2020-12-29 NOTE — Progress Notes (Signed)
Pulmonary Critical Care Medicine [place]   PULMONARY CRITICAL CARE SERVICE  PROGRESS NOTE  Date of Service: 12/29/2020  Jamie Young  IRJ:[id number]  DOB: 29-Jun-1946   DOA: 12/18/2020  Referring Physician: Merton Border, MD  HPI: Jamie Young is a 75 y.o. male seen for follow up of Acute on Chronic Respiratory Failure.  Patient currently is on T collar has been on 28% FiO2 with the PMV in place  Medications: Reviewed on Rounds  Physical Exam:  Vitals: Temperature 96.5 pulse 87 respiratory rate is 20 blood pressure is 135/69 saturations 99%  Ventilator Settings on T collar with an FiO2 28%  . General: Comfortable at this time . Eyes: Grossly normal lids, irises & conjunctiva . ENT: grossly tongue is normal . Neck: no obvious mass . Cardiovascular: S1 S2 normal no gallop . Respiratory: Scattered rhonchi expansion is equal . Abdomen: soft . Skin: no rash seen on limited exam . Musculoskeletal: not rigid . Psychiatric:unable to assess . Neurologic: no seizure no involuntary movements         Lab Data:   Basic Metabolic Panel: Recent Labs  Lab 12/25/20 0647 12/26/20 0901 12/27/20 0433  NA 136  --  141  K 5.5* 4.3 4.1  CL 98  --  102  CO2 28  --  31  GLUCOSE 131*  --  129*  BUN 34*  --  35*  CREATININE 0.50*  --  0.44*  CALCIUM 10.0  --  10.1    ABG: No results for input(s): PHART, PCO2ART, PO2ART, HCO3, O2SAT in the last 168 hours.  Liver Function Tests: No results for input(s): AST, ALT, ALKPHOS, BILITOT, PROT, ALBUMIN in the last 168 hours. No results for input(s): LIPASE, AMYLASE in the last 168 hours. No results for input(s): AMMONIA in the last 168 hours.  CBC: Recent Labs  Lab 12/25/20 0647  WBC 10.3  HGB 10.4*  HCT 35.4*  MCV 99.4  PLT 388    Cardiac Enzymes: No results for input(s): CKTOTAL, CKMB, CKMBINDEX, TROPONINI in the last 168 hours.  BNP (last 3 results) No results for input(s): BNP in the last 8760  hours.  ProBNP (last 3 results) No results for input(s): PROBNP in the last 8760 hours.  Radiological Exams: No results found.  Assessment/Plan Active Problems:   NSCLC of left lung (HCC)   Acute on chronic respiratory failure with hypoxia (HCC)   Acute lower GI bleeding   COVID-19 virus infection   COPD, severe ([place])   1. Acute on chronic respiratory failure hypoxia we will continue with T collar use PMV as ordered 2. Non-small cell cancer of the lung no change supportive care 3. Acute GI bleed patient not having any active bleeding 4. COVID-19 virus infection recovery 5. Severe COPD medical management   I have personally seen and evaluated the patient, evaluated laboratory and imaging results, formulated the assessment and plan and placed orders. The Patient requires high complexity decision making with multiple systems involvement.  Rounds were done with the Respiratory Therapy Director and Staff therapists and discussed with nursing staff also.  Allyne Gee, MD [facility] Pulmonary Critical Care Medicine Sleep Medicine

## 2020-12-30 ENCOUNTER — Other Ambulatory Visit (HOSPITAL_COMMUNITY): Payer: No Typology Code available for payment source

## 2020-12-30 DIAGNOSIS — K922 Gastrointestinal hemorrhage, unspecified: Secondary | ICD-10-CM | POA: Diagnosis not present

## 2020-12-30 DIAGNOSIS — J449 Chronic obstructive pulmonary disease, unspecified: Secondary | ICD-10-CM | POA: Diagnosis not present

## 2020-12-30 DIAGNOSIS — J9621 Acute and chronic respiratory failure with hypoxia: Secondary | ICD-10-CM | POA: Diagnosis not present

## 2020-12-30 DIAGNOSIS — U071 COVID-19: Secondary | ICD-10-CM | POA: Diagnosis not present

## 2020-12-30 LAB — BASIC METABOLIC PANEL
Anion gap: 10 (ref 5–15)
BUN: 41 mg/dL — ABNORMAL HIGH (ref 8–23)
CO2: 26 mmol/L (ref 22–32)
Calcium: 9.2 mg/dL (ref 8.9–10.3)
Chloride: 106 mmol/L (ref 98–111)
Creatinine, Ser: 0.41 mg/dL — ABNORMAL LOW (ref 0.61–1.24)
GFR, Estimated: >60 mL/min (ref >60)
Glucose, Bld: 154 mg/dL — ABNORMAL HIGH (ref 70–99)
Potassium: 5.3 mmol/L — ABNORMAL HIGH (ref 3.5–5.1)
Sodium: 142 mmol/L (ref 135–145)

## 2020-12-30 LAB — CBC
HCT: 35.6 % — ABNORMAL LOW (ref 39.0–52.0)
Hemoglobin: 10.2 g/dL — ABNORMAL LOW (ref 13.0–17.0)
MCH: 29.2 pg (ref 26.0–34.0)
MCHC: 28.7 g/dL — ABNORMAL LOW (ref 30.0–36.0)
MCV: 102 fL — ABNORMAL HIGH (ref 80.0–100.0)
Platelets: 318 10*3/uL (ref 150–400)
RBC: 3.49 MIL/uL — ABNORMAL LOW (ref 4.22–5.81)
RDW: 17.9 % — ABNORMAL HIGH (ref 11.5–15.5)
WBC: 11.9 10*3/uL — ABNORMAL HIGH (ref 4.0–10.5)
nRBC: 0 % (ref 0.0–0.2)

## 2020-12-30 LAB — PROTIME-INR
INR: 2.2 — ABNORMAL HIGH (ref 0.8–1.2)
Prothrombin Time: 23.6 seconds — ABNORMAL HIGH (ref 11.4–15.2)

## 2020-12-30 NOTE — Progress Notes (Signed)
Pulmonary Critical Care Medicine [place]   PULMONARY CRITICAL CARE SERVICE  PROGRESS NOTE  Date of Service: 12/30/2020  VALGENE DELOATCH  ZJI:[id number]  DOB: December 27, 1945   DOA: 12/18/2020  Referring Physician: Merton Border, MD  HPI: HOLDYN POYSER is a 75 y.o. male seen for follow up of Acute on Chronic Respiratory Failure.  Patient remains on T collar still with copious secretions  Medications: Reviewed on Rounds  Physical Exam:  Vitals: Temperature is 97.1 pulse 94 respiratory 22 blood pressure is 155/68 saturations 100%  Ventilator Settings on T collar with an FiO2 of 28%  . General: Comfortable at this time . Eyes: Grossly normal lids, irises & conjunctiva . ENT: grossly tongue is normal . Neck: no obvious mass . Cardiovascular: S1 S2 normal no gallop . Respiratory: Scattered rhonchi expansion is equal . Abdomen: soft . Skin: no rash seen on limited exam . Musculoskeletal: not rigid . Psychiatric:unable to assess . Neurologic: no seizure no involuntary movements         Lab Data:   Basic Metabolic Panel: Recent Labs  Lab 12/25/20 0647 12/26/20 0901 12/27/20 0433 12/30/20 0441  NA 136  --  141 142  K 5.5* 4.3 4.1 5.3*  CL 98  --  102 106  CO2 28  --  31 26  GLUCOSE 131*  --  129* 154*  BUN 34*  --  35* 41*  CREATININE 0.50*  --  0.44* 0.41*  CALCIUM 10.0  --  10.1 9.2    ABG: No results for input(s): PHART, PCO2ART, PO2ART, HCO3, O2SAT in the last 168 hours.  Liver Function Tests: No results for input(s): AST, ALT, ALKPHOS, BILITOT, PROT, ALBUMIN in the last 168 hours. No results for input(s): LIPASE, AMYLASE in the last 168 hours. No results for input(s): AMMONIA in the last 168 hours.  CBC: Recent Labs  Lab 12/25/20 0647 12/30/20 0441  WBC 10.3 11.9*  HGB 10.4* 10.2*  HCT 35.4* 35.6*  MCV 99.4 102.0*  PLT 388 318    Cardiac Enzymes: No results for input(s): CKTOTAL, CKMB, CKMBINDEX, TROPONINI in the last 168  hours.  BNP (last 3 results) No results for input(s): BNP in the last 8760 hours.  ProBNP (last 3 results) No results for input(s): PROBNP in the last 8760 hours.  Radiological Exams: DG Abd 1 View  Result Date: 12/29/2020 CLINICAL DATA:  Nasogastric tube placement. EXAM: ABDOMEN - 1 VIEW COMPARISON:  None. FINDINGS: Weighted enteric tube tip in the left upper quadrant in the proximal stomach. The tip is not post pyloric. There is minimal retained barium in the stomach from barium swallow earlier today. No evidence of obstruction in the included upper abdomen. IMPRESSION: Weighted enteric tube tip in the proximal stomach. The tip is not post pyloric. Electronically Signed   By: Keith Rake M.D.   On: 12/29/2020 19:03   DG CHEST PORT 1 VIEW  Result Date: 12/30/2020 CLINICAL DATA:  Respiratory failure. EXAM: PORTABLE CHEST 1 VIEW COMPARISON:  Chest x-ray 12/25/2020, 11/06/2020. FINDINGS: Tracheostomy tube and feeding tube in stable position. Right PICC line stable position. Prior median sternotomy. Heart size normal. Stable mild interstitial prominence, most prominent right mid lung, again noted again. Findings again are most likely chronic. No focal alveolar infiltrate or pleural effusion or pneumothorax. IMPRESSION: 1. Lines and tubes in stable position. 2. Prior median sternotomy. Heart size normal. 3. Stable mild interstitial prominence, most prominent right mid lung, again noted. Findings again are most likely chronic.  Electronically Signed   By: Marcello Moores  Register   On: 12/30/2020 06:12    Assessment/Plan Active Problems:   NSCLC of left lung (HCC)   Acute on chronic respiratory failure with hypoxia (HCC)   Acute lower GI bleeding   COVID-19 virus infection   COPD, severe ([place])   1. Acute on chronic respiratory failure hypoxia we will continue with on T-piece titrate oxygen continue pulmonary toilet 2. Non-small cell cancer lung mass status post resection 3. Acute lower GI bleed no  active bleeding 4. COVID-19 virus infection in recovery 5. Severe COPD medical management   I have personally seen and evaluated the patient, evaluated laboratory and imaging results, formulated the assessment and plan and placed orders. The Patient requires high complexity decision making with multiple systems involvement.  Rounds were done with the Respiratory Therapy Director and Staff therapists and discussed with nursing staff also.  Allyne Gee, MD [facility] Pulmonary Critical Care Medicine Sleep Medicine

## 2020-12-31 DIAGNOSIS — J449 Chronic obstructive pulmonary disease, unspecified: Secondary | ICD-10-CM | POA: Diagnosis not present

## 2020-12-31 DIAGNOSIS — K922 Gastrointestinal hemorrhage, unspecified: Secondary | ICD-10-CM | POA: Diagnosis not present

## 2020-12-31 DIAGNOSIS — U071 COVID-19: Secondary | ICD-10-CM | POA: Diagnosis not present

## 2020-12-31 DIAGNOSIS — J9621 Acute and chronic respiratory failure with hypoxia: Secondary | ICD-10-CM | POA: Diagnosis not present

## 2020-12-31 LAB — BASIC METABOLIC PANEL
Anion gap: 11 (ref 5–15)
BUN: 39 mg/dL — ABNORMAL HIGH (ref 8–23)
CO2: 31 mmol/L (ref 22–32)
Calcium: 10.2 mg/dL (ref 8.9–10.3)
Chloride: 103 mmol/L (ref 98–111)
Creatinine, Ser: 0.48 mg/dL — ABNORMAL LOW (ref 0.61–1.24)
GFR, Estimated: >60 mL/min (ref >60)
Glucose, Bld: 145 mg/dL — ABNORMAL HIGH (ref 70–99)
Potassium: 4.1 mmol/L (ref 3.5–5.1)
Sodium: 145 mmol/L (ref 135–145)

## 2020-12-31 LAB — PROTIME-INR
INR: 2.7 — ABNORMAL HIGH (ref 0.8–1.2)
Prothrombin Time: 27.8 seconds — ABNORMAL HIGH (ref 11.4–15.2)

## 2020-12-31 NOTE — Progress Notes (Signed)
Pulmonary Critical Care Medicine [place]   PULMONARY CRITICAL CARE SERVICE  PROGRESS NOTE  Date of Service: 12/31/2020  Jamie Young  QIO:[id number]  DOB: [birth year]   DOA: 12/18/2020  Referring Physician: Merton Border, MD  HPI: Jamie Young is a 75 y.o. male seen for follow up of Acute on Chronic Respiratory Failure.  Patient right now is off the ventilator on T collar appears to be comfortable  Medications: Reviewed on Rounds  Physical Exam:  Vitals: Temperature is 97.6 pulse 99 respiratory rate 32 blood pressure is 148/84 saturations 100%  Ventilator Settings on T collar FiO2 28%  . General: Comfortable at this time . Eyes: Grossly normal lids, irises & conjunctiva . ENT: grossly tongue is normal . Neck: no obvious mass . Cardiovascular: S1 S2 normal no gallop . Respiratory: Scattered rhonchi expansion is equal . Abdomen: soft . Skin: no rash seen on limited exam . Musculoskeletal: not rigid . Psychiatric:unable to assess . Neurologic: no seizure no involuntary movements         Lab Data:   Basic Metabolic Panel: Recent Labs  Lab 12/25/20 0647 12/26/20 0901 12/27/20 0433 12/30/20 0441 12/31/20 0346  NA 136  --  141 142 145  K 5.5* 4.3 4.1 5.3* 4.1  CL 98  --  102 106 103  CO2 28  --  31 26 31   GLUCOSE 131*  --  129* 154* 145*  BUN 34*  --  35* 41* 39*  CREATININE 0.50*  --  0.44* 0.41* 0.48*  CALCIUM 10.0  --  10.1 9.2 10.2    ABG: No results for input(s): PHART, PCO2ART, PO2ART, HCO3, O2SAT in the last 168 hours.  Liver Function Tests: No results for input(s): AST, ALT, ALKPHOS, BILITOT, PROT, ALBUMIN in the last 168 hours. No results for input(s): LIPASE, AMYLASE in the last 168 hours. No results for input(s): AMMONIA in the last 168 hours.  CBC: Recent Labs  Lab 12/25/20 0647 12/30/20 0441  WBC 10.3 11.9*  HGB 10.4* 10.2*  HCT 35.4* 35.6*  MCV 99.4 102.0*  PLT 388 318    Cardiac Enzymes: No results for  input(s): CKTOTAL, CKMB, CKMBINDEX, TROPONINI in the last 168 hours.  BNP (last 3 results) No results for input(s): BNP in the last 8760 hours.  ProBNP (last 3 results) No results for input(s): PROBNP in the last 8760 hours.  Radiological Exams: DG Abd 1 View  Result Date: 12/29/2020 CLINICAL DATA:  Nasogastric tube placement. EXAM: ABDOMEN - 1 VIEW COMPARISON:  None. FINDINGS: Weighted enteric tube tip in the left upper quadrant in the proximal stomach. The tip is not post pyloric. There is minimal retained barium in the stomach from barium swallow earlier today. No evidence of obstruction in the included upper abdomen. IMPRESSION: Weighted enteric tube tip in the proximal stomach. The tip is not post pyloric. Electronically Signed   By: Keith Rake M.D.   On: 12/29/2020 19:03   DG CHEST PORT 1 VIEW  Result Date: 12/30/2020 CLINICAL DATA:  Respiratory failure. EXAM: PORTABLE CHEST 1 VIEW COMPARISON:  Chest x-ray 12/25/2020, 11/06/2020. FINDINGS: Tracheostomy tube and feeding tube in stable position. Right PICC line stable position. Prior median sternotomy. Heart size normal. Stable mild interstitial prominence, most prominent right mid lung, again noted again. Findings again are most likely chronic. No focal alveolar infiltrate or pleural effusion or pneumothorax. IMPRESSION: 1. Lines and tubes in stable position. 2. Prior median sternotomy. Heart size normal. 3. Stable mild interstitial prominence,  most prominent right mid lung, again noted. Findings again are most likely chronic. Electronically Signed   By: Marcello Moores  Register   On: 12/30/2020 06:12    Assessment/Plan Active Problems:   NSCLC of left lung (HCC)   Acute on chronic respiratory failure with hypoxia (HCC)   Acute lower GI bleeding   COVID-19 virus infection   COPD, severe ([place])   1. Acute on chronic respiratory failure hypoxia we will continue with the T collar titrate oxygen continue pulmonary toilet 2. Non-small cell  cancer of the lung supportive care 3. Acute GI bleed no active bleeding 4. COVID-19 virus infection resolved 5. Severe COPD patient is at baseline right now   I have personally seen and evaluated the patient, evaluated laboratory and imaging results, formulated the assessment and plan and placed orders. The Patient requires high complexity decision making with multiple systems involvement.  Rounds were done with the Respiratory Therapy Director and Staff therapists and discussed with nursing staff also.  Allyne Gee, MD [facility] Pulmonary Critical Care Medicine Sleep Medicine

## 2021-01-01 ENCOUNTER — Other Ambulatory Visit (HOSPITAL_COMMUNITY): Payer: No Typology Code available for payment source

## 2021-01-01 DIAGNOSIS — J449 Chronic obstructive pulmonary disease, unspecified: Secondary | ICD-10-CM | POA: Diagnosis not present

## 2021-01-01 DIAGNOSIS — U071 COVID-19: Secondary | ICD-10-CM | POA: Diagnosis not present

## 2021-01-01 DIAGNOSIS — K922 Gastrointestinal hemorrhage, unspecified: Secondary | ICD-10-CM | POA: Diagnosis not present

## 2021-01-01 DIAGNOSIS — J9621 Acute and chronic respiratory failure with hypoxia: Secondary | ICD-10-CM | POA: Diagnosis not present

## 2021-01-01 LAB — PROTIME-INR
INR: 3.5 — ABNORMAL HIGH (ref 0.8–1.2)
Prothrombin Time: 34.2 seconds — ABNORMAL HIGH (ref 11.4–15.2)

## 2021-01-01 LAB — CULTURE, RESPIRATORY W GRAM STAIN

## 2021-01-01 NOTE — Progress Notes (Signed)
Pulmonary Critical Care Medicine [place]   PULMONARY CRITICAL CARE SERVICE  PROGRESS NOTE  Date of Service: 01/01/2021  Jamie Young  CZY:[id number]  DOB: Jun 23, 1946   DOA: 12/18/2020  Referring Physician: Merton Border, MD  HPI: Jamie Young is a 75 y.o. male seen for follow up of Acute on Chronic Respiratory Failure.  Patient currently is on T collar has been on 40% FiO2 copious secretions removed  Medications: Reviewed on Rounds  Physical Exam:  Vitals: Temperature is 96.9 pulse 105 respiratory rate 30 blood pressure is 138/78 saturations 98%  Ventilator Settings on T collar with an FiO2 of 40%  . General: Comfortable at this time . Eyes: Grossly normal lids, irises & conjunctiva . ENT: grossly tongue is normal . Neck: no obvious mass . Cardiovascular: S1 S2 normal no gallop . Respiratory: Scattered coarse breath sounds noted bilaterally . Abdomen: soft . Skin: no rash seen on limited exam . Musculoskeletal: not rigid . Psychiatric:unable to assess . Neurologic: no seizure no involuntary movements         Lab Data:   Basic Metabolic Panel: Recent Labs  Lab 12/26/20 0901 12/27/20 0433 12/30/20 0441 12/31/20 0346  NA  --  141 142 145  K 4.3 4.1 5.3* 4.1  CL  --  102 106 103  CO2  --  31 26 31   GLUCOSE  --  129* 154* 145*  BUN  --  35* 41* 39*  CREATININE  --  0.44* 0.41* 0.48*  CALCIUM  --  10.1 9.2 10.2    ABG: No results for input(s): PHART, PCO2ART, PO2ART, HCO3, O2SAT in the last 168 hours.  Liver Function Tests: No results for input(s): AST, ALT, ALKPHOS, BILITOT, PROT, ALBUMIN in the last 168 hours. No results for input(s): LIPASE, AMYLASE in the last 168 hours. No results for input(s): AMMONIA in the last 168 hours.  CBC: Recent Labs  Lab 12/30/20 0441  WBC 11.9*  HGB 10.2*  HCT 35.6*  MCV 102.0*  PLT 318    Cardiac Enzymes: No results for input(s): CKTOTAL, CKMB, CKMBINDEX, TROPONINI in the last 168  hours.  BNP (last 3 results) No results for input(s): BNP in the last 8760 hours.  ProBNP (last 3 results) No results for input(s): PROBNP in the last 8760 hours.  Radiological Exams: DG Abd Portable 1V  Result Date: 01/01/2021 CLINICAL DATA:  Nasogastric tube placement. EXAM: PORTABLE ABDOMEN - 1 VIEW COMPARISON:  December 29, 2020. FINDINGS: The bowel gas pattern is normal. Nasogastric tube tip is seen in proximal stomach. No radio-opaque calculi or other significant radiographic abnormality are seen. IMPRESSION: Nasogastric tube tip seen in proximal stomach. No definite evidence of bowel obstruction or ileus. Electronically Signed   By: Marijo Conception M.D.   On: 01/01/2021 08:36    Assessment/Plan Active Problems:   NSCLC of left lung (HCC)   Acute on chronic respiratory failure with hypoxia (HCC)   Acute lower GI bleeding   COVID-19 virus infection   COPD, severe ([place])   1. Acute on chronic respiratory failure hypoxia we will continue with PE on T collar patient has copious secretions limiting [place] from being able to proceed further with the weaning. 2. Non-small cell cancer of the lung no change 3. Acute GI bleed no active bleeding 4. COVID-19 virus infection recovery 5. COPD severe disease   I have personally seen and evaluated the patient, evaluated laboratory and imaging results, formulated the assessment and plan and placed orders.  The Patient requires high complexity decision making with multiple systems involvement.  Rounds were done with the Respiratory Therapy Director and Staff therapists and discussed with nursing staff also.  Allyne Gee, MD [facility] Pulmonary Critical Care Medicine Sleep Medicine

## 2021-01-02 DIAGNOSIS — U071 COVID-19: Secondary | ICD-10-CM | POA: Diagnosis not present

## 2021-01-02 DIAGNOSIS — K922 Gastrointestinal hemorrhage, unspecified: Secondary | ICD-10-CM | POA: Diagnosis not present

## 2021-01-02 DIAGNOSIS — J449 Chronic obstructive pulmonary disease, unspecified: Secondary | ICD-10-CM | POA: Diagnosis not present

## 2021-01-02 DIAGNOSIS — J9621 Acute and chronic respiratory failure with hypoxia: Secondary | ICD-10-CM | POA: Diagnosis not present

## 2021-01-02 LAB — PROTIME-INR
INR: 4.1 (ref 0.8–1.2)
INR: 3.1 — ABNORMAL HIGH (ref 0.8–1.2)
Prothrombin Time: 38.5 seconds — ABNORMAL HIGH (ref 11.4–15.2)
Prothrombin Time: 30.6 seconds — ABNORMAL HIGH (ref 11.4–15.2)

## 2021-01-02 NOTE — Progress Notes (Signed)
Pulmonary Critical Care Medicine [place]   PULMONARY CRITICAL CARE SERVICE  PROGRESS NOTE  Date of Service: 01/02/2021  JESS TONEY  ZDG:[id number]  DOB: May 08, 1946   DOA: 12/18/2020  Referring Physician: Merton Border, MD  HPI: Jamie Young is a 75 y.o. male seen for follow up of Acute on Chronic Respiratory Failure.  Patient this time is on T collar has been on 35% FiO2 with good saturation  Medications: Reviewed on Rounds  Physical Exam:  Vitals: Temperature 96.4 pulse 94 respiratory rate is 25 blood pressure is 125/68 saturations 97%  Ventilator Settings off the ventilator on T collar currently on 35% FiO2  . General: Comfortable at this time . Eyes: Grossly normal lids, irises & conjunctiva . ENT: grossly tongue is normal . Neck: no obvious mass . Cardiovascular: S1 S2 normal no gallop . Respiratory: Scattered rhonchi expansion is equal . Abdomen: soft . Skin: no rash seen on limited exam . Musculoskeletal: not rigid . Psychiatric:unable to assess . Neurologic: no seizure no involuntary movements         Lab Data:   Basic Metabolic Panel: Recent Labs  Lab 12/27/20 0433 12/30/20 0441 12/31/20 0346  NA 141 142 145  K 4.1 5.3* 4.1  CL 102 106 103  CO2 31 26 31   GLUCOSE 129* 154* 145*  BUN 35* 41* 39*  CREATININE 0.44* 0.41* 0.48*  CALCIUM 10.1 9.2 10.2    ABG: No results for input(s): PHART, PCO2ART, PO2ART, HCO3, O2SAT in the last 168 hours.  Liver Function Tests: No results for input(s): AST, ALT, ALKPHOS, BILITOT, PROT, ALBUMIN in the last 168 hours. No results for input(s): LIPASE, AMYLASE in the last 168 hours. No results for input(s): AMMONIA in the last 168 hours.  CBC: Recent Labs  Lab 12/30/20 0441  WBC 11.9*  HGB 10.2*  HCT 35.6*  MCV 102.0*  PLT 318    Cardiac Enzymes: No results for input(s): CKTOTAL, CKMB, CKMBINDEX, TROPONINI in the last 168 hours.  BNP (last 3 results) No results for input(s): BNP  in the last 8760 hours.  ProBNP (last 3 results) No results for input(s): PROBNP in the last 8760 hours.  Radiological Exams: DG Abd Portable 1V  Result Date: 01/01/2021 CLINICAL DATA:  Nasogastric tube placement. EXAM: PORTABLE ABDOMEN - 1 VIEW COMPARISON:  December 29, 2020. FINDINGS: The bowel gas pattern is normal. Nasogastric tube tip is seen in proximal stomach. No radio-opaque calculi or other significant radiographic abnormality are seen. IMPRESSION: Nasogastric tube tip seen in proximal stomach. No definite evidence of bowel obstruction or ileus. Electronically Signed   By: Marijo Conception M.D.   On: 01/01/2021 08:36    Assessment/Plan Active Problems:   NSCLC of left lung (HCC)   Acute on chronic respiratory failure with hypoxia (HCC)   Acute lower GI bleeding   COVID-19 virus infection   COPD, severe ([place])   1. Acute on chronic respiratory failure hypoxia we will continue with the T collar titrate oxygen continue pulmonary toilet. 2. Non-small cell cancer of the lung no change supportive care 3. COVID-19 virus infection recovery 4. Severe COPD medical management 5. GI bleed no active bleeding   I have personally seen and evaluated the patient, evaluated laboratory and imaging results, formulated the assessment and plan and placed orders. The Patient requires high complexity decision making with multiple systems involvement.  Rounds were done with the Respiratory Therapy Director and Staff therapists and discussed with nursing staff also.  Yameli Delamater  Richardson Dopp, MD [facility] Pulmonary Critical Care Medicine Sleep Medicine

## 2021-01-03 LAB — PROTIME-INR
INR: 2.9 — ABNORMAL HIGH (ref 0.8–1.2)
Prothrombin Time: 29.2 seconds — ABNORMAL HIGH (ref 11.4–15.2)

## 2021-01-04 DIAGNOSIS — U071 COVID-19: Secondary | ICD-10-CM | POA: Diagnosis not present

## 2021-01-04 DIAGNOSIS — K922 Gastrointestinal hemorrhage, unspecified: Secondary | ICD-10-CM | POA: Diagnosis not present

## 2021-01-04 DIAGNOSIS — J9621 Acute and chronic respiratory failure with hypoxia: Secondary | ICD-10-CM | POA: Diagnosis not present

## 2021-01-04 DIAGNOSIS — J449 Chronic obstructive pulmonary disease, unspecified: Secondary | ICD-10-CM | POA: Diagnosis not present

## 2021-01-04 LAB — PROTIME-INR
INR: 2.1 — ABNORMAL HIGH (ref 0.8–1.2)
Prothrombin Time: 23.1 seconds — ABNORMAL HIGH (ref 11.4–15.2)

## 2021-01-04 NOTE — Progress Notes (Signed)
Pulmonary Critical Care Medicine [place]   PULMONARY CRITICAL CARE SERVICE  PROGRESS NOTE  Date of Service: 01/04/2021  KEYMON MCELROY  ITG:[id number]  DOB: Dec 11, 1945   DOA: 12/18/2020  Referring Physician: Merton Border, MD  HPI: Jamie Young is a 75 y.o. male seen for follow up of Acute on Chronic Respiratory Failure.  At this time patient is on T collar has been on 28% FiO2 good saturations are noted  Medications: Reviewed on Rounds  Physical Exam:  Vitals: Temperature is 98.3 pulse 88 respiratory 18 blood pressure is 127/51 saturations 100%  Ventilator Settings on T collar FiO2 28%  . General: Comfortable at this time . Eyes: Grossly normal lids, irises & conjunctiva . ENT: grossly tongue is normal . Neck: no obvious mass . Cardiovascular: S1 S2 normal no gallop . Respiratory: No rhonchi very coarse breath sounds . Abdomen: soft . Skin: no rash seen on limited exam . Musculoskeletal: not rigid . Psychiatric:unable to assess . Neurologic: no seizure no involuntary movements         Lab Data:   Basic Metabolic Panel: Recent Labs  Lab 12/30/20 0441 12/31/20 0346  NA 142 145  K 5.3* 4.1  CL 106 103  CO2 26 31  GLUCOSE 154* 145*  BUN 41* 39*  CREATININE 0.41* 0.48*  CALCIUM 9.2 10.2    ABG: No results for input(s): PHART, PCO2ART, PO2ART, HCO3, O2SAT in the last 168 hours.  Liver Function Tests: No results for input(s): AST, ALT, ALKPHOS, BILITOT, PROT, ALBUMIN in the last 168 hours. No results for input(s): LIPASE, AMYLASE in the last 168 hours. No results for input(s): AMMONIA in the last 168 hours.  CBC: Recent Labs  Lab 12/30/20 0441  WBC 11.9*  HGB 10.2*  HCT 35.6*  MCV 102.0*  PLT 318    Cardiac Enzymes: No results for input(s): CKTOTAL, CKMB, CKMBINDEX, TROPONINI in the last 168 hours.  BNP (last 3 results) No results for input(s): BNP in the last 8760 hours.  ProBNP (last 3 results) No results for input(s):  PROBNP in the last 8760 hours.  Radiological Exams: No results found.  Assessment/Plan Active Problems:   NSCLC of left lung (HCC)   Acute on chronic respiratory failure with hypoxia (HCC)   Acute lower GI bleeding   COVID-19 virus infection   COPD, severe ([place])   1. Acute on chronic respiratory failure hypoxia we will continue T collar titrate oxygen continue.  Patient has been on 28% FiO2. 2. 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 Lower GI bleed no change 4. COVID-19 virus infection in recovery 5. Severe COPD patient is at baseline we will continue to monitor.   I have personally seen and evaluated the patient, evaluated laboratory and imaging results, formulated the assessment and plan and placed orders. The Patient requires high complexity decision making with multiple systems involvement.  Rounds were done with the Respiratory Therapy Director and Staff therapists and discussed with nursing staff also.  Allyne Gee, MD [facility] Pulmonary Critical Care Medicine Sleep Medicine

## 2021-01-05 DIAGNOSIS — U071 COVID-19: Secondary | ICD-10-CM | POA: Diagnosis not present

## 2021-01-05 DIAGNOSIS — K922 Gastrointestinal hemorrhage, unspecified: Secondary | ICD-10-CM | POA: Diagnosis not present

## 2021-01-05 DIAGNOSIS — J449 Chronic obstructive pulmonary disease, unspecified: Secondary | ICD-10-CM | POA: Diagnosis not present

## 2021-01-05 DIAGNOSIS — J9621 Acute and chronic respiratory failure with hypoxia: Secondary | ICD-10-CM | POA: Diagnosis not present

## 2021-01-05 LAB — CBC
HCT: 36.3 % — ABNORMAL LOW (ref 39.0–52.0)
Hemoglobin: 11.2 g/dL — ABNORMAL LOW (ref 13.0–17.0)
MCH: 30.1 pg (ref 26.0–34.0)
MCHC: 30.9 g/dL (ref 30.0–36.0)
MCV: 97.6 fL (ref 80.0–100.0)
Platelets: 219 10*3/uL (ref 150–400)
RBC: 3.72 MIL/uL — ABNORMAL LOW (ref 4.22–5.81)
RDW: 16.5 % — ABNORMAL HIGH (ref 11.5–15.5)
WBC: 12.5 10*3/uL — ABNORMAL HIGH (ref 4.0–10.5)
nRBC: 0 % (ref 0.0–0.2)

## 2021-01-05 LAB — BASIC METABOLIC PANEL
Anion gap: 8 (ref 5–15)
BUN: 42 mg/dL — ABNORMAL HIGH (ref 8–23)
CO2: 30 mmol/L (ref 22–32)
Calcium: 9.9 mg/dL (ref 8.9–10.3)
Chloride: 106 mmol/L (ref 98–111)
Creatinine, Ser: 0.42 mg/dL — ABNORMAL LOW (ref 0.61–1.24)
GFR, Estimated: >60 mL/min (ref >60)
Glucose, Bld: 170 mg/dL — ABNORMAL HIGH (ref 70–99)
Potassium: 3.8 mmol/L (ref 3.5–5.1)
Sodium: 144 mmol/L (ref 135–145)

## 2021-01-05 LAB — PROTIME-INR
INR: 1.7 — ABNORMAL HIGH (ref 0.8–1.2)
Prothrombin Time: 19.6 seconds — ABNORMAL HIGH (ref 11.4–15.2)

## 2021-01-05 NOTE — Progress Notes (Signed)
Pulmonary Critical Care Medicine [place]   PULMONARY CRITICAL CARE SERVICE  PROGRESS NOTE  Date of Service: 01/05/2021  Jamie Young  ALP:[id number]  DOB: 08/02/1946   DOA: 12/18/2020  Referring Physician: Merton Border, MD  HPI: Jamie Young is a 75 y.o. male seen for follow up of Acute on Chronic Respiratory Failure.  Patient remains on T collar has been on 28% FiO2 good saturations are noted.  Medications: Reviewed on Rounds  Physical Exam:  Vitals: Temperature is 95.8 pulse 84 respiratory 25 blood pressure is 127/68 saturations 99%  Ventilator Settings on T collar FiO2 28%  . General: Comfortable at this time . Eyes: Grossly normal lids, irises & conjunctiva . ENT: grossly tongue is normal . Neck: no obvious mass . Cardiovascular: S1 S2 normal no gallop . Respiratory: Scattered rhonchi coarse breath sounds . Abdomen: soft . Skin: no rash seen on limited exam . Musculoskeletal: not rigid . Psychiatric:unable to assess . Neurologic: no seizure no involuntary movements         Lab Data:   Basic Metabolic Panel: Recent Labs  Lab 12/30/20 0441 12/31/20 0346 01/05/21 0422  NA 142 145 144  K 5.3* 4.1 3.8  CL 106 103 106  CO2 26 31 30   GLUCOSE 154* 145* 170*  BUN 41* 39* 42*  CREATININE 0.41* 0.48* 0.42*  CALCIUM 9.2 10.2 9.9    ABG: No results for input(s): PHART, PCO2ART, PO2ART, HCO3, O2SAT in the last 168 hours.  Liver Function Tests: No results for input(s): AST, ALT, ALKPHOS, BILITOT, PROT, ALBUMIN in the last 168 hours. No results for input(s): LIPASE, AMYLASE in the last 168 hours. No results for input(s): AMMONIA in the last 168 hours.  CBC: Recent Labs  Lab 12/30/20 0441 01/05/21 0422  WBC 11.9* 12.5*  HGB 10.2* 11.2*  HCT 35.6* 36.3*  MCV 102.0* 97.6  PLT 318 219    Cardiac Enzymes: No results for input(s): CKTOTAL, CKMB, CKMBINDEX, TROPONINI in the last 168 hours.  BNP (last 3 results) No results for  input(s): BNP in the last 8760 hours.  ProBNP (last 3 results) No results for input(s): PROBNP in the last 8760 hours.  Radiological Exams: No results found.  Assessment/Plan Active Problems:   NSCLC of left lung (HCC)   Acute on chronic respiratory failure with hypoxia (HCC)   Acute lower GI bleeding   COVID-19 virus infection   COPD, severe ([place])   1. Acute on chronic respiratory failure hypoxia we will continue with the T collar titrate oxygen as tolerated continue pulmonary toilet. 2. 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 Acute GI bleed no active bleeding 4. COVID-19 virus infection recovery 5. Severe COPD medical management   I have personally seen and evaluated the patient, evaluated laboratory and imaging results, formulated the assessment and plan and placed orders. The Patient requires high complexity decision making with multiple systems involvement.  Rounds were done with the Respiratory Therapy Director and Staff therapists and discussed with nursing staff also.  Allyne Gee, MD [facility] Pulmonary Critical Care Medicine Sleep Medicine

## 2021-01-06 ENCOUNTER — Other Ambulatory Visit (HOSPITAL_COMMUNITY): Payer: No Typology Code available for payment source

## 2021-01-06 DIAGNOSIS — J449 Chronic obstructive pulmonary disease, unspecified: Secondary | ICD-10-CM | POA: Diagnosis not present

## 2021-01-06 DIAGNOSIS — K922 Gastrointestinal hemorrhage, unspecified: Secondary | ICD-10-CM | POA: Diagnosis not present

## 2021-01-06 DIAGNOSIS — J9621 Acute and chronic respiratory failure with hypoxia: Secondary | ICD-10-CM | POA: Diagnosis not present

## 2021-01-06 DIAGNOSIS — U071 COVID-19: Secondary | ICD-10-CM | POA: Diagnosis not present

## 2021-01-06 LAB — PROTIME-INR
INR: 2.3 — ABNORMAL HIGH (ref 0.8–1.2)
Prothrombin Time: 24.5 seconds — ABNORMAL HIGH (ref 11.4–15.2)

## 2021-01-06 NOTE — Progress Notes (Signed)
Pulmonary Critical Care Medicine [place]   PULMONARY CRITICAL CARE SERVICE  PROGRESS NOTE  Date of Service: 01/06/2021  NOLAN TUAZON  LKG:[id number]  DOB: 01-Nov-1946   DOA: 12/18/2020  Referring Physician: Merton Border, MD  HPI: Jamie Young is a 75 y.o. male seen for follow up of Acute on Chronic Respiratory Failure.  Patient is on T collar at this time has been on 28% FiO2  Medications: Reviewed on Rounds  Physical Exam:  Vitals: Temperature is 97.7 pulse 88 respiratory 28 blood pressure is 118/88 saturations 98%  Ventilator Settings on T collar FiO2 28%  . General: Comfortable at this time . Eyes: Grossly normal lids, irises & conjunctiva . ENT: grossly tongue is normal . Neck: no obvious mass . Cardiovascular: S1 S2 normal no gallop . Respiratory: No rhonchi very coarse breath sounds . Abdomen: soft . Skin: no rash seen on limited exam . Musculoskeletal: not rigid . Psychiatric:unable to assess . Neurologic: no seizure no involuntary movements         Lab Data:   Basic Metabolic Panel: Recent Labs  Lab 12/31/20 0346 01/05/21 0422  NA 145 144  K 4.1 3.8  CL 103 106  CO2 31 30  GLUCOSE 145* 170*  BUN 39* 42*  CREATININE 0.48* 0.42*  CALCIUM 10.2 9.9    ABG: No results for input(s): PHART, PCO2ART, PO2ART, HCO3, O2SAT in the last 168 hours.  Liver Function Tests: No results for input(s): AST, ALT, ALKPHOS, BILITOT, PROT, ALBUMIN in the last 168 hours. No results for input(s): LIPASE, AMYLASE in the last 168 hours. No results for input(s): AMMONIA in the last 168 hours.  CBC: Recent Labs  Lab 01/05/21 0422  WBC 12.5*  HGB 11.2*  HCT 36.3*  MCV 97.6  PLT 219    Cardiac Enzymes: No results for input(s): CKTOTAL, CKMB, CKMBINDEX, TROPONINI in the last 168 hours.  BNP (last 3 results) No results for input(s): BNP in the last 8760 hours.  ProBNP (last 3 results) No results for input(s): PROBNP in the last 8760  hours.  Radiological Exams: No results found.  Assessment/Plan Active Problems:   NSCLC of left lung (HCC)   Acute on chronic respiratory failure with hypoxia (HCC)   Acute lower GI bleeding   COVID-19 virus infection   COPD, severe ([place])   1. Acute on chronic respiratory failure with hypoxia we will continue with T collar trials titrate oxygen at this time. 2. Non-small cell cancer of the lung supportive care 3. Acute GI bleed patient is at baseline 4. COVID-19 virus infection recovery 5. Severe COPD at baseline   I have personally seen and evaluated the patient, evaluated laboratory and imaging results, formulated the assessment and plan and placed orders. The Patient requires high complexity decision making with multiple systems involvement.  Rounds were done with the Respiratory Therapy Director and Staff therapists and discussed with nursing staff also.  Allyne Gee, MD [facility] Pulmonary Critical Care Medicine Sleep Medicine

## 2021-01-07 ENCOUNTER — Encounter: Payer: Self-pay | Admitting: Internal Medicine

## 2021-01-07 DIAGNOSIS — J9621 Acute and chronic respiratory failure with hypoxia: Secondary | ICD-10-CM | POA: Diagnosis not present

## 2021-01-07 DIAGNOSIS — U071 COVID-19: Secondary | ICD-10-CM | POA: Diagnosis not present

## 2021-01-07 DIAGNOSIS — K922 Gastrointestinal hemorrhage, unspecified: Secondary | ICD-10-CM | POA: Diagnosis not present

## 2021-01-07 DIAGNOSIS — J449 Chronic obstructive pulmonary disease, unspecified: Secondary | ICD-10-CM | POA: Diagnosis not present

## 2021-01-07 LAB — PROTIME-INR
INR: 2.9 — ABNORMAL HIGH (ref 0.8–1.2)
INR: 3.1 — ABNORMAL HIGH (ref 0.8–1.2)
Prothrombin Time: 29.7 seconds — ABNORMAL HIGH (ref 11.4–15.2)
Prothrombin Time: 31.2 seconds — ABNORMAL HIGH (ref 11.4–15.2)

## 2021-01-07 NOTE — H&P (Signed)
Chief Complaint: Dysphagia  Referring Physician(s): Hijazi  Supervising Physician: Corrie Mckusick  Patient Status: [facility] - In-pt  History of Present Illness: Jamie Young is a 75 y.o. male with respiratory failure secondary to Covid pneumonia.  Original diagnosis was 11/06/20.  He has a tracheostomy. He has an NGT in place.  He takes warfarin for mechanical heart valve.  We are asked to place a gastrostomy tube.   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 Aortic mechanical   NSCLC of left lung ([place]) 05/26/2020   Personal history of prostate cancer 05/29/2020   Stroke ([place]) 04/2020   Tobacco use disorder 05/26/2020    Past Surgical History:  Procedure Laterality Date   AORTIC VALVE REPAIR      Allergies: Penicillin g and Lisinopril  Medications: Prior to Admission medications   Medication Sig Start Date End Date Taking? Authorizing Provider  Ascorbic Acid (VITAMIN C) 1000 MG tablet Take 1,000 mg by mouth daily.    [provider]  aspirin 81 MG chewable tablet Chew 81 mg by mouth daily.    [provider]  Carboxymethylcellulose Sod PF 1 % GEL Place 1 drop into both eyes at bedtime.    [provider]  Cholecalciferol 50 MCG (2000 UT) TABS Take 2,000 Units by mouth daily.    [provider]  dexamethasone (DECADRON) 6 MG tablet Take 1 tablet (6 mg total) by mouth daily. 11/13/20   Elgergawy, Silver Huguenin, MD  dorzolamidel-timolol (COSOPT) 22.3-6.8 MG/ML SOLN ophthalmic solution Place 1 drop into both eyes 2 (two) times daily.    [provider]  feeding supplement (ENSURE ENLIVE / ENSURE PLUS) LIQD Take 237 mLs by mouth 3 (three) times daily between meals. 11/12/20   Elgergawy, Silver Huguenin, MD  latanoprost (XALATAN) 0.005 % ophthalmic solution Place 1 drop into both eyes at bedtime.    [provider]  pantoprazole (PROTONIX) 40 MG tablet Take 1 tablet (40 mg total) by mouth daily. 11/12/20 12/12/20  Elgergawy, Silver Huguenin, MD  rosuvastatin (CRESTOR) 5 MG tablet Take 1 tablet (5 mg total) by mouth daily. 11/13/20   Elgergawy, Silver Huguenin, MD  warfarin (COUMADIN) 5 MG tablet Please take 5 mg oral every evening starting tomorrow 12/17, for nex 3 days, then discuss with your warfarin clinic at the [facility] the appropriate dose to continue, as they will follow on your INR every  48-hour for next few days. 11/12/20   Elgergawy, Silver Huguenin, MD     Family History  Problem Relation Age of Onset   Bladder Cancer Father    Lung cancer Father     Social History   Socioeconomic History   Marital status: Married    Spouse name: Not on file   Number of children: Not on file   Years of education: Not on file   Highest education level: Not on file  Occupational History   Not on file  Tobacco Use   Smoking status: Former Smoker   Smokeless tobacco: Never Used  Substance and Sexual Activity   Alcohol use: Not on file   Drug use: Not on file   Sexual activity: Not on file  Other Topics Concern  Not on file  Social History Narrative   Not on file   Social Determinants of Health   Financial Resource Strain: Not on file  Food Insecurity: Not on file  Transportation Needs: Not on file  Physical Activity: Not on file  Stress: Not on file  Social Connections: Not on file     Review of Systems: A 12 point ROS discussed and pertinent positives are indicated in the HPI above.  All other systems are negative.  Review of Systems  Vital Signs: There were no vitals taken for this visit.  Physical Exam Constitutional:      Appearance: Normal appearance.  HENT:     Head: Normocephalic and atraumatic.  Eyes:     Extraocular Movements: Extraocular movements intact.  Neck:      Comments: Tracheostomy/trach collar Cardiovascular:     Rate and Rhythm: Normal rate and regular rhythm.  Pulmonary:     Breath sounds: Normal breath sounds.  Abdominal:     Palpations: Abdomen is soft.     Tenderness: There is no abdominal tenderness.     Comments: Midline dressing in place, Right ostomy.  Musculoskeletal:        General: Normal range of motion.     Cervical back: Normal range of motion.  Skin:    General: Skin is warm and dry.  Neurological:     General: No focal deficit present.     Mental Status: He is alert and oriented to person, place, and time.  Psychiatric:        Mood and Affect: Mood normal.        Behavior: Behavior normal.        Thought Content: Thought content normal.        Judgment: Judgment normal.     Imaging: CT ABDOMEN WO CONTRAST  Result Date: 01/06/2021 CLINICAL DATA:  Evaluate gastric anatomy prior to potential percutaneous gastrostomy tube placement. EXAM: CT ABDOMEN WITHOUT CONTRAST TECHNIQUE: Multidetector CT imaging of the abdomen was performed following the standard protocol without IV contrast. COMPARISON:  CT abdomen pelvis-12/04/2020 FINDINGS: The lack of intravenous contrast limits the ability to evaluate solid abdominal organs. Lower chest: Limited visualization of the lower thorax demonstrates occlusion of imaged right lower lobe bronchi with associated consolidative opacities findings worrisome for aspiration. Advanced emphysematous change within the imaged bases. No pleural effusion. Normal heart size. Post median sternotomy and aortic valve replacement. Calcifications mitral valve annulus. Fusiform ectasia the imaged base of descending thoracic aorta. Hepatobiliary: Normal hepatic contour. High density material seen within the gallbladder potentially vicarious excretion of contrast versus radiopaque biliary sludge (image 34, series 3). No gallbladder wall thickening or pericholecystic stranding on this noncontrast examination.  Pancreas: Normal noncontrast appearance of pancreas. Spleen: Normal noncontrast appearance of the spleen. Adrenals/Urinary Tract: Note is made of a punctate (approximately 0.5 cm) nonobstructing right-sided renal stone. No definite evidence of left-sided nephrolithiasis. No urinary obstruction. Minimal amount of likely age and body habitus related perinephric stranding. Normal noncontrast appearance the bilateral adrenal glands. The urinary bladder was not imaged. Stomach/Bowel: The anterior wall the stomach is well apposed against the ventral wall of the upper abdomen without interposition hepatic parenchyma or the transverse colon. Enteric tube terminates within the mid body of the stomach. 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 No evidence of enteric obstruction. No pneumoperitoneum pneumatosis or venous gas. Vascular/Lymphatic: Rather extensive calcified atherosclerotic plaque within a normal caliber aorta. No bulky retroperitoneal, porta hepatis or mesenteric on this noncontrast examination.  Other: Presumed healing midline abdominal incision without hernia. Musculoskeletal: No acute or aggressive osseous abnormalities. 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 IMPRESSION: 1. Gastric anatomy amenable to potential percutaneous gastrostomy tube placement as indicated. 2. 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 3. Limited visualization of the lower thorax demonstrates occlusion of imaged right lower lobe bronchi with associated consolidative opacities findings worrisome for aspiration. 4. Aortic Atherosclerosis (ICD10-I70.0). Electronically Signed   By: Sandi Mariscal M.D.   On: 01/06/2021 15:58   DG Abd 1 View  Result Date: 12/29/2020 CLINICAL DATA:  Nasogastric tube placement. EXAM:  ABDOMEN - 1 VIEW COMPARISON:  None. FINDINGS: Weighted enteric tube tip in the left upper quadrant in the proximal stomach. The tip is not post pyloric. There is minimal retained barium in the stomach from barium swallow earlier today. No evidence of obstruction in the included upper abdomen. IMPRESSION: Weighted enteric tube tip in the proximal stomach. The tip is not post pyloric. Electronically Signed   By: Keith Rake M.D.   On: 12/29/2020 19:03   CT HEAD WO CONTRAST  Result Date: 12/20/2020 CLINICAL DATA:  Altered level of consciousness, history of respiratory failure and COVID 19 EXAM: CT HEAD WITHOUT CONTRAST TECHNIQUE: Contiguous axial images were obtained from the base of the skull through the vertex without intravenous contrast. COMPARISON:  11/07/2020, 11/08/2020 FINDINGS: Brain: Stable hypodensities throughout the periventricular white matter and basal ganglia consistent with chronic small vessel ischemic changes. No acute infarct or hemorrhage. Lateral ventricles and remaining midline structures are unremarkable. No acute extra-axial fluid collections. No mass effect. Vascular: No hyperdense vessel or unexpected calcification. Skull: Normal. Negative for fracture or focal lesion. Sinuses/Orbits: No acute finding. Other: None. IMPRESSION: 1. 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 No acute intracranial process. Electronically Signed   By: Randa Ngo M.D.   On: 12/20/2020 18:37   DG CHEST PORT 1 VIEW  Result Date: 12/30/2020 CLINICAL DATA:  Respiratory failure. EXAM: PORTABLE CHEST 1 VIEW COMPARISON:  Chest x-ray 12/25/2020, 11/06/2020. FINDINGS: Tracheostomy tube and feeding tube in stable position. Right PICC line stable position. Prior median sternotomy. Heart size normal. Stable mild interstitial prominence, most prominent right mid lung, again noted again. Findings again are most likely chronic. No focal alveolar infiltrate or pleural  effusion or pneumothorax. IMPRESSION: 1. Lines and tubes in stable position. 2. Prior median sternotomy. Heart size normal. 3. Stable mild interstitial prominence, most prominent right mid lung, again noted. Findings again are most likely chronic. Electronically Signed   By: Marcello Moores  Register   On: 12/30/2020 06:12   DG CHEST PORT 1 VIEW  Result Date: 12/25/2020 CLINICAL DATA:  75 year old male respiratory failure. History of COVID-19. EXAM: PORTABLE CHEST 1 VIEW COMPARISON:  Portable chest 12/20/2020 and earlier. FINDINGS: Portable AP semi upright view at 0543 hours. Stable tracheostomy tube. Stable enteric tube and right PICC line. Pulmonary hyperinflation. Stable lung volumes. Mediastinal contours remain within normal limits, prior sternotomy. Diffusely increased pulmonary interstitium is approaching baseline compared to 11/06/2020 radiographs, but remains diffusely increased with basilar predominant reticulonodular changes greater on the left. No pneumothorax. No definite pleural effusion. No new pulmonary opacity. Stable visualized osseous structures. IMPRESSION: 1. Chronic lung disease with acute on chronic reticulonodular density most pronounced at the left lung base. Favor related to viral respiratory infection. 2. No  new cardiopulmonary abnormality. 3.  Stable lines and tubes. Electronically Signed   By: Genevie Ann M.D.   On: 12/25/2020 06:03   DG Chest Port 1 View  Result Date: 12/20/2020 CLINICAL DATA:  75 year old male with leukocytosis. EXAM: PORTABLE CHEST 1 VIEW COMPARISON:  Chest radiograph dated 12/18/2020. FINDINGS: Tracheostomy above the carina in similar position. Enteric tube extends below the diaphragm with side-port in the proximal stomach and tip beyond the inferior margin of the image. Right-sided PICC in similar position. Improved aeration of the left lung base compared to prior radiograph. Small faint bilateral opacities primarily involving the right mid lung field and left lower lung  field remain. No pleural effusion or pneumothorax. Stable cardiac silhouette. Median sternotomy wires and CABG vascular clips. Atherosclerotic calcification of the aorta. No acute osseous pathology. IMPRESSION: Improved aeration of the left lung base compared to prior radiograph. Electronically Signed   By: Anner Crete M.D.   On: 12/20/2020 19:52   DG Chest Port 1 View  Result Date: 12/18/2020 CLINICAL DATA:  Respiratory failure, enteric catheter placement EXAM: PORTABLE CHEST 1 VIEW COMPARISON:  11/08/2020 FINDINGS: Single frontal view of the chest demonstrates tracheostomy tube overlying tracheal air column. Enteric catheter passes below diaphragm tip and side port projecting over gastric fundus. Right internal jugular catheter and right-sided PICC are noted, tips overlying superior vena cava. Cardiac silhouette is stable. Patchy consolidation at the left lung base. Trace left effusion is suspected. No pneumothorax. IMPRESSION: 1. Patchy left basilar consolidation and trace left pleural effusion, which could reflect pneumonia in the appropriate setting. 2. Support devices as above. Electronically Signed   By: Randa Ngo M.D.   On: 12/18/2020 23:32   DG Abd Portable 1V  Result Date: 01/01/2021 CLINICAL DATA:  Nasogastric tube placement. EXAM: PORTABLE ABDOMEN - 1 VIEW COMPARISON:  December 29, 2020. FINDINGS: The bowel gas pattern is normal. Nasogastric tube tip is seen in proximal stomach. No radio-opaque calculi or other significant radiographic abnormality are seen. IMPRESSION: Nasogastric tube tip seen in proximal stomach. No definite evidence of bowel obstruction or ileus. Electronically Signed   By: Marijo Conception M.D.   On: 01/01/2021 08:36   DG Abd Portable 1V  Result Date: 12/18/2020 CLINICAL DATA:  NG tube.  Respiratory failure EXAM: PORTABLE ABDOMEN - 1 VIEW COMPARISON:  None. FINDINGS: An enteric tube is present with tip in the left upper quadrant consistent with location in the body  of the stomach. Scattered gas and stool in the colon. No small or large bowel distention. No radiopaque stones. Catheter projected over the pelvis consistent with a Foley catheter. Vascular calcifications. Degenerative changes in the spine and hips. IMPRESSION: Enteric tube tip is in the left upper quadrant consistent with location in the body of the stomach. Electronically Signed   By: Lucienne Capers M.D.   On: 12/18/2020 23:33    Labs:  CBC: Recent Labs    12/22/20 0525 12/25/20 0647 12/30/20 0441 01/05/21 0422  WBC 14.0* 10.3 11.9* 12.5*  HGB 9.5* 10.4* 10.2* 11.2*  HCT 30.3* 35.4* 35.6* 36.3*  PLT 437* 388 318 219    COAGS: Recent Labs    01/04/21 0334 01/05/21 0422 01/06/21 0412 01/07/21 0315  INR 2.1* 1.7* 2.3* 2.9*    BMP: Recent Labs    12/27/20 0433 12/30/20 0441 12/31/20 0346 01/05/21 0422  NA 141 142 145 144  K 4.1 5.3* 4.1 3.8  CL 102 106 103 106  CO2 31 26 31 30   GLUCOSE 129*  154* 145* 170*  BUN 35* 41* 39* 42*  CALCIUM 10.1 9.2 10.2 9.9  CREATININE 0.44* 0.41* 0.48* 0.42*  GFRNONAA >60 >60 >60 >60    LIVER FUNCTION TESTS: Recent Labs    11/10/20 0428 11/12/20 0454 12/19/20 0554 12/20/20 1755  BILITOT 0.7 0.9 0.7 1.0  AST 29 36 48* 62*  ALT 25 40 87* 90*  ALKPHOS 44 50 219* 221*  PROT 5.7* 5.7* 6.7 6.9  ALBUMIN 3.0* 3.0* 2.9* 3.1*    TUMOR MARKERS: No results for input(s): AFPTM, CEA, CA199, CHROMGRNA in the last 8760 hours.  Assessment and Plan:  Recovering from Covid pneumonia. Trach collar. Dysphagia with NGT in place.   Will place gastrostomy tube when INR < 1.6. Dr. Laren Everts aware to hold warfarin and bridge with heparin drip.  We will plan for Monday as IR schedule allows. Will call and instruct when to stop heparin prior to procedure.  Risks and benefits image guided gastrostomy tube placement was discussed with the patient including, but not limited to the need for a barium enema during the procedure, bleeding, infection,  peritonitis and/or damage to adjacent structures.  All of the patient's questions were answered, patient is agreeable to proceed.  Consent signed and in chart.  Thank you for this interesting consult.  I greatly enjoyed meeting Jamie Young and look forward to participating in their care.  A copy of this report was sent to the requesting provider on this date.  Electronically Signed: Murrell Redden, PA-C   01/07/2021, 12:14 PM      I spent a total of 20 Minutes in face to face in clinical consultation, greater than 50% of which was counseling/coordinating care for gtube placement.

## 2021-01-07 NOTE — Progress Notes (Signed)
Pulmonary Critical Care Medicine [place]   PULMONARY CRITICAL CARE SERVICE  PROGRESS NOTE  Date of Service: 01/07/2021  Jamie Young  IRS:[id number]  DOB: May 19, 1946   DOA: 12/18/2020  Referring Physician: Merton Border, MD  HPI: Jamie Young is a 75 y.o. male seen for follow up of Acute on Chronic Respiratory Failure.  Patient is currently on T collar has been on 28% FiO2 seems to be tolerating T collar fairly well at this time however still has very copious secretions so therefore not able to tolerate capping  Medications: Reviewed on Rounds  Physical Exam:  Vitals: Temperature is 97.7 pulse 78 respiratory 15 blood pressure is 132/85 saturations 100%  Ventilator Settings on T collar with an FiO2 of 28%  . General: Comfortable at this time . Eyes: Grossly normal lids, irises & conjunctiva . ENT: grossly tongue is normal . Neck: no obvious mass . Cardiovascular: S1 S2 normal no gallop . Respiratory: Scattered rhonchi expansion is equal at this time . Abdomen: soft . Skin: no rash seen on limited exam . Musculoskeletal: not rigid . Psychiatric:unable to assess . Neurologic: no seizure no involuntary movements         Lab Data:   Basic Metabolic Panel: Recent Labs  Lab 01/05/21 0422  NA 144  K 3.8  CL 106  CO2 30  GLUCOSE 170*  BUN 42*  CREATININE 0.42*  CALCIUM 9.9    ABG: No results for input(s): PHART, PCO2ART, PO2ART, HCO3, O2SAT in the last 168 hours.  Liver Function Tests: No results for input(s): AST, ALT, ALKPHOS, BILITOT, PROT, ALBUMIN in the last 168 hours. No results for input(s): LIPASE, AMYLASE in the last 168 hours. No results for input(s): AMMONIA in the last 168 hours.  CBC: Recent Labs  Lab 01/05/21 0422  WBC 12.5*  HGB 11.2*  HCT 36.3*  MCV 97.6  PLT 219    Cardiac Enzymes: No results for input(s): CKTOTAL, CKMB, CKMBINDEX, TROPONINI in the last 168 hours.  BNP (last 3 results) No results for input(s):  BNP in the last 8760 hours.  ProBNP (last 3 results) No results for input(s): PROBNP in the last 8760 hours.  Radiological Exams: CT ABDOMEN WO CONTRAST  Result Date: 01/06/2021 CLINICAL DATA:  Evaluate gastric anatomy prior to potential percutaneous gastrostomy tube placement. EXAM: CT ABDOMEN WITHOUT CONTRAST TECHNIQUE: Multidetector CT imaging of the abdomen was performed following the standard protocol without IV contrast. COMPARISON:  CT abdomen pelvis-12/04/2020 FINDINGS: The lack of intravenous contrast limits the ability to evaluate solid abdominal organs. Lower chest: Limited visualization of the lower thorax demonstrates occlusion of imaged right lower lobe bronchi with associated consolidative opacities findings worrisome for aspiration. Advanced emphysematous change within the imaged bases. No pleural effusion. Normal heart size. Post median sternotomy and aortic valve replacement. Calcifications mitral valve annulus. Fusiform ectasia the imaged base of descending thoracic aorta. Hepatobiliary: Normal hepatic contour. High density material seen within the gallbladder potentially vicarious excretion of contrast versus radiopaque biliary sludge (image 34, series 3). No gallbladder wall thickening or pericholecystic stranding on this noncontrast examination. Pancreas: Normal noncontrast appearance of pancreas. Spleen: Normal noncontrast appearance of the spleen. Adrenals/Urinary Tract: Note is made of a punctate (approximately 0.5 cm) nonobstructing right-sided renal stone. No definite evidence of left-sided nephrolithiasis. No urinary obstruction. Minimal amount of likely age and body habitus related perinephric stranding. Normal noncontrast appearance the bilateral adrenal glands. The urinary bladder was not imaged. Stomach/Bowel: The anterior wall the stomach is well  apposed against the ventral wall of the upper abdomen without interposition hepatic parenchyma or the transverse colon. Enteric tube  terminates within the mid body of the stomach. 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 No evidence of enteric obstruction. No pneumoperitoneum pneumatosis or venous gas. Vascular/Lymphatic: Rather extensive calcified atherosclerotic plaque within a normal caliber aorta. No bulky retroperitoneal, porta hepatis or mesenteric on this noncontrast examination. Other: Presumed healing midline abdominal incision without hernia. Musculoskeletal: No acute or aggressive osseous abnormalities. 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 IMPRESSION: 1. Gastric anatomy amenable to potential percutaneous gastrostomy tube placement as indicated. 2. 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 3. Limited visualization of the lower thorax demonstrates occlusion of imaged right lower lobe bronchi with associated consolidative opacities findings worrisome for aspiration. 4. Aortic Atherosclerosis (ICD10-I70.0). Electronically Signed   By: Sandi Mariscal M.D.   On: 01/06/2021 15:58    Assessment/Plan Active Problems:   NSCLC of left lung (HCC)   Acute on chronic respiratory failure with hypoxia (HCC)   Acute lower GI bleeding   COVID-19 virus infection   COPD, severe ([place])   1. Acute on chronic respiratory failure hypoxia we will continue with T collar trials titrate oxygen continue pulmonary toilet. 2. Acute GI bleed no change we will continue to monitor 3. COVID-19 virus infection recovery 4. Severe COPD no change continue to follow   I have personally seen and evaluated the patient, evaluated laboratory and imaging results, formulated the assessment and plan and placed orders. The Patient requires high complexity decision making with multiple systems involvement.  Rounds were done with the  Respiratory Therapy Director and Staff therapists and discussed with nursing staff also.  Allyne Gee, MD [facility] Pulmonary Critical Care Medicine Sleep Medicine

## 2021-01-08 DIAGNOSIS — J449 Chronic obstructive pulmonary disease, unspecified: Secondary | ICD-10-CM | POA: Diagnosis not present

## 2021-01-08 DIAGNOSIS — U071 COVID-19: Secondary | ICD-10-CM | POA: Diagnosis not present

## 2021-01-08 DIAGNOSIS — J9621 Acute and chronic respiratory failure with hypoxia: Secondary | ICD-10-CM | POA: Diagnosis not present

## 2021-01-08 DIAGNOSIS — K922 Gastrointestinal hemorrhage, unspecified: Secondary | ICD-10-CM | POA: Diagnosis not present

## 2021-01-08 LAB — PROTIME-INR
INR: 3.1 — ABNORMAL HIGH (ref 0.8–1.2)
Prothrombin Time: 31 seconds — ABNORMAL HIGH (ref 11.4–15.2)

## 2021-01-08 NOTE — Progress Notes (Signed)
Pulmonary Critical Care Medicine [place]   PULMONARY CRITICAL CARE SERVICE  PROGRESS NOTE  Date of Service: 01/08/2021  Jamie Young  [id number]:[id number]  DOB: 1945-12-21   DOA: 12/18/2020  Referring Physician: Merton Border, MD  HPI: Jamie Young is a 75 y.o. male seen for follow up of Acute on Chronic Respiratory Failure.  Patient currently is on T collar has been on 20% FiO2 with good saturations  Medications: Reviewed on Rounds  Physical Exam:  Vitals: Temperature is 97.6 pulse 112 respiratory rate is 23 blood pressure is 117/49 saturations 97%  Ventilator Settings on T collar with an FiO2 28%  . General: Comfortable at this time . Eyes: Grossly normal lids, irises & conjunctiva . ENT: grossly tongue is normal . Neck: no obvious mass . Cardiovascular: S1 S2 normal no gallop . Respiratory: Scattered rhonchi expansion is equal . Abdomen: soft . Skin: no rash seen on limited exam . Musculoskeletal: not rigid . Psychiatric:unable to assess . Neurologic: no seizure no involuntary movements         Lab Data:   Basic Metabolic Panel: Recent Labs  Lab 01/05/21 0422  NA 144  K 3.8  CL 106  CO2 30  GLUCOSE 170*  BUN 42*  CREATININE 0.42*  CALCIUM 9.9    ABG: No results for input(s): PHART, PCO2ART, PO2ART, HCO3, O2SAT in the last 168 hours.  Liver Function Tests: No results for input(s): AST, ALT, ALKPHOS, BILITOT, PROT, ALBUMIN in the last 168 hours. No results for input(s): LIPASE, AMYLASE in the last 168 hours. No results for input(s): AMMONIA in the last 168 hours.  CBC: Recent Labs  Lab 01/05/21 0422  WBC 12.5*  HGB 11.2*  HCT 36.3*  MCV 97.6  PLT 219    Cardiac Enzymes: No results for input(s): CKTOTAL, CKMB, CKMBINDEX, TROPONINI in the last 168 hours.  BNP (last 3 results) No results for input(s): BNP in the last 8760 hours.  ProBNP (last 3 results) No results for input(s): PROBNP in the last 8760  hours.  Radiological Exams: CT ABDOMEN WO CONTRAST  Result Date: 01/06/2021 CLINICAL DATA:  Evaluate gastric anatomy prior to potential percutaneous gastrostomy tube placement. EXAM: CT ABDOMEN WITHOUT CONTRAST TECHNIQUE: Multidetector CT imaging of the abdomen was performed following the standard protocol without IV contrast. COMPARISON:  CT abdomen pelvis-12/04/2020 FINDINGS: The lack of intravenous contrast limits the ability to evaluate solid abdominal organs. Lower chest: Limited visualization of the lower thorax demonstrates occlusion of imaged right lower lobe bronchi with associated consolidative opacities findings worrisome for aspiration. Advanced emphysematous change within the imaged bases. No pleural effusion. Normal heart size. Post median sternotomy and aortic valve replacement. Calcifications mitral valve annulus. Fusiform ectasia the imaged base of descending thoracic aorta. Hepatobiliary: Normal hepatic contour. High density material seen within the gallbladder potentially vicarious excretion of contrast versus radiopaque biliary sludge (image 34, series 3). No gallbladder wall thickening or pericholecystic stranding on this noncontrast examination. Pancreas: Normal noncontrast appearance of pancreas. Spleen: Normal noncontrast appearance of the spleen. Adrenals/Urinary Tract: Note is made of a punctate (approximately 0.5 cm) nonobstructing right-sided renal stone. No definite evidence of left-sided nephrolithiasis. No urinary obstruction. Minimal amount of likely age and body habitus related perinephric stranding. Normal noncontrast appearance the bilateral adrenal glands. The urinary bladder was not imaged. Stomach/Bowel: The anterior wall the stomach is well apposed against the ventral wall of the upper abdomen without interposition hepatic parenchyma or the transverse colon. Enteric tube terminates within the mid  body of the stomach. Post end colostomy within the right lower abdominal  quadrant. No evidence of enteric obstruction. No pneumoperitoneum pneumatosis or venous gas. Vascular/Lymphatic: Rather extensive calcified atherosclerotic plaque within a normal caliber aorta. No bulky retroperitoneal, porta hepatis or mesenteric on this noncontrast examination. Other: Presumed healing midline abdominal incision without hernia. Musculoskeletal: No acute or aggressive osseous abnormalities. 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 IMPRESSION: 1. Gastric anatomy amenable to potential percutaneous gastrostomy tube placement as indicated. 2. 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 3. Limited visualization of the lower thorax demonstrates occlusion of imaged right lower lobe bronchi with associated consolidative opacities findings worrisome for aspiration. 4. Aortic Atherosclerosis (ICD10-I70.0). Electronically Signed   By: Sandi Mariscal M.D.   On: 01/06/2021 15:58    Assessment/Plan Active Problems:   NSCLC of left lung (HCC)   Acute on chronic respiratory failure with hypoxia (HCC)   Acute lower GI bleeding   COVID-19 virus infection   COPD, severe ([place])   1. Acute on chronic respiratory failure hypoxia plan is to continue with the T-piece patient is on 20% FiO2 2. Non-small cell cancer of the lung supportive care 3. Acute GI bleed no change we will continue to follow 4. COVID-19 virus infection recovery 5. Severe COPD medical management   I have personally seen and evaluated the patient, evaluated laboratory and imaging results, formulated the assessment and plan and placed orders. The Patient requires high complexity decision making with multiple systems involvement.  Rounds were done with the Respiratory Therapy Director and Staff therapists and discussed with nursing staff  also.  Allyne Gee, MD [facility] Pulmonary Critical Care Medicine Sleep Medicine

## 2021-01-09 LAB — PROTIME-INR
INR: 2.7 — ABNORMAL HIGH (ref 0.8–1.2)
Prothrombin Time: 27.5 seconds — ABNORMAL HIGH (ref 11.4–15.2)

## 2021-01-10 DIAGNOSIS — J9621 Acute and chronic respiratory failure with hypoxia: Secondary | ICD-10-CM | POA: Diagnosis not present

## 2021-01-10 DIAGNOSIS — U071 COVID-19: Secondary | ICD-10-CM | POA: Diagnosis not present

## 2021-01-10 DIAGNOSIS — J449 Chronic obstructive pulmonary disease, unspecified: Secondary | ICD-10-CM | POA: Diagnosis not present

## 2021-01-10 DIAGNOSIS — K922 Gastrointestinal hemorrhage, unspecified: Secondary | ICD-10-CM | POA: Diagnosis not present

## 2021-01-10 LAB — PROTIME-INR
INR: 1.9 — ABNORMAL HIGH (ref 0.8–1.2)
Prothrombin Time: 20.8 seconds — ABNORMAL HIGH (ref 11.4–15.2)

## 2021-01-10 NOTE — Progress Notes (Signed)
Pulmonary Critical Care Medicine [place]   PULMONARY CRITICAL CARE SERVICE  PROGRESS NOTE  Date of Service: 01/10/2021  Jamie Young  [id number]:[id number]  DOB: 1946/10/10   DOA: 12/18/2020  Referring Physician: Merton Border, MD  HPI: Jamie Young is a 75 y.o. male seen for follow up of Acute on Chronic Respiratory Failure.  Patient currently is on T collar has been on 28% FiO2  Medications: Reviewed on Rounds  Physical Exam:  Vitals: Temperature is 97.1 pulse 85 respiratory 20 blood pressure is 151/57 saturations 98%  Ventilator Settings off the ventilator on T collar with an FiO2 of 28%  . General: Comfortable at this time . Eyes: Grossly normal lids, irises & conjunctiva . ENT: grossly tongue is normal . Neck: no obvious mass . Cardiovascular: S1 S2 normal no gallop . Respiratory: Scattered rhonchi expansion is equal . Abdomen: soft . Skin: no rash seen on limited exam . Musculoskeletal: not rigid . Psychiatric:unable to assess . Neurologic: no seizure no involuntary movements         Lab Data:   Basic Metabolic Panel: Recent Labs  Lab 01/05/21 0422  NA 144  K 3.8  CL 106  CO2 30  GLUCOSE 170*  BUN 42*  CREATININE 0.42*  CALCIUM 9.9    ABG: No results for input(s): PHART, PCO2ART, PO2ART, HCO3, O2SAT in the last 168 hours.  Liver Function Tests: No results for input(s): AST, ALT, ALKPHOS, BILITOT, PROT, ALBUMIN in the last 168 hours. No results for input(s): LIPASE, AMYLASE in the last 168 hours. No results for input(s): AMMONIA in the last 168 hours.  CBC: Recent Labs  Lab 01/05/21 0422  WBC 12.5*  HGB 11.2*  HCT 36.3*  MCV 97.6  PLT 219    Cardiac Enzymes: No results for input(s): CKTOTAL, CKMB, CKMBINDEX, TROPONINI in the last 168 hours.  BNP (last 3 results) No results for input(s): BNP in the last 8760 hours.  ProBNP (last 3 results) No results for input(s): PROBNP in the last 8760 hours.  Radiological  Exams: No results found.  Assessment/Plan Active Problems:   NSCLC of left lung (HCC)   Acute on chronic respiratory failure with hypoxia (HCC)   Acute lower GI bleeding   COVID-19 virus infection   COPD, severe ([place])   1. Acute on chronic respiratory failure hypoxia we will continue T collar trials on 28% FiO2.  Continue supportive care.  Patient has copious amounts of secretions limiting factor as far as being able to wean any further 2. Acute GI bleed no change we will continue to follow along 3. COVID-19 virus infection recovery 4. Severe COPD we will continue to follow 5. 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 The Patient requires high complexity decision making with multiple systems involvement.  Rounds were done with the Respiratory Therapy Director and Staff therapists and discussed with nursing staff also.  Allyne Gee, MD [facility] Pulmonary Critical Care Medicine Sleep Medicine

## 2021-01-11 ENCOUNTER — Other Ambulatory Visit (HOSPITAL_COMMUNITY): Payer: No Typology Code available for payment source

## 2021-01-11 DIAGNOSIS — U071 COVID-19: Secondary | ICD-10-CM | POA: Diagnosis not present

## 2021-01-11 DIAGNOSIS — J9621 Acute and chronic respiratory failure with hypoxia: Secondary | ICD-10-CM | POA: Diagnosis not present

## 2021-01-11 DIAGNOSIS — K922 Gastrointestinal hemorrhage, unspecified: Secondary | ICD-10-CM | POA: Diagnosis not present

## 2021-01-11 DIAGNOSIS — J449 Chronic obstructive pulmonary disease, unspecified: Secondary | ICD-10-CM | POA: Diagnosis not present

## 2021-01-11 HISTORY — PX: IR GASTROSTOMY TUBE MOD SED: IMG625

## 2021-01-11 LAB — CBC
HCT: 41.8 % (ref 39.0–52.0)
Hemoglobin: 12.6 g/dL — ABNORMAL LOW (ref 13.0–17.0)
MCH: 29 pg (ref 26.0–34.0)
MCHC: 30.1 g/dL (ref 30.0–36.0)
MCV: 96.3 fL (ref 80.0–100.0)
Platelets: 357 10*3/uL (ref 150–400)
RBC: 4.34 MIL/uL (ref 4.22–5.81)
RDW: 16.5 % — ABNORMAL HIGH (ref 11.5–15.5)
WBC: 14.4 10*3/uL — ABNORMAL HIGH (ref 4.0–10.5)
nRBC: 0 % (ref 0.0–0.2)

## 2021-01-11 LAB — PROTIME-INR
INR: 1.3 — ABNORMAL HIGH (ref 0.8–1.2)
INR: 1.2 (ref 0.8–1.2)
Prothrombin Time: 16.1 seconds — ABNORMAL HIGH (ref 11.4–15.2)
Prothrombin Time: 15.1 seconds (ref 11.4–15.2)

## 2021-01-11 LAB — APTT: aPTT: 31 seconds (ref 24–36)

## 2021-01-11 MED ORDER — FENTANYL CITRATE (PF) 100 MCG/2ML IJ SOLN
INTRAMUSCULAR | Status: AC
  Filled 2021-01-11: qty 2

## 2021-01-11 MED ORDER — MIDAZOLAM HCL 2 MG/2ML IJ SOLN
INTRAMUSCULAR | Status: AC | PRN
  Administered 2021-01-11: 1 mg via INTRAVENOUS

## 2021-01-11 MED ORDER — FENTANYL CITRATE (PF) 100 MCG/2ML IJ SOLN
INTRAMUSCULAR | Status: AC | PRN
  Administered 2021-01-11: 25 ug via INTRAVENOUS

## 2021-01-11 MED ORDER — IOHEXOL 300 MG/ML  SOLN
50.0000 mL | Freq: Once | INTRAMUSCULAR | Status: AC | PRN
  Administered 2021-01-11: 15 mL

## 2021-01-11 MED ORDER — MIDAZOLAM HCL 2 MG/2ML IJ SOLN
INTRAMUSCULAR | Status: AC
  Filled 2021-01-11: qty 2

## 2021-01-11 MED ORDER — LIDOCAINE HCL (PF) 1 % IJ SOLN
INTRAMUSCULAR | Status: AC | PRN
Start: 2021-01-11 — End: 2021-01-11
  Administered 2021-01-11: 10 mL

## 2021-01-11 MED ORDER — LIDOCAINE HCL 1 % IJ SOLN
INTRAMUSCULAR | Status: AC
  Filled 2021-01-11: qty 20

## 2021-01-11 MED ORDER — VANCOMYCIN HCL 1000 MG/200ML IV SOLN
1000.0000 mg | INTRAVENOUS | Status: AC
  Filled 2021-01-11: qty 200

## 2021-01-11 NOTE — Procedures (Signed)
Interventional Radiology Procedure Note  Procedure: Placement of percutaneous 20F pull-through gastrostomy tube. Complications: None Recommendations: - NPO except for sips and chips remainder of today and overnight - Maintain G-tube to LWS until tomorrow morning  - May advance diet as tolerated and begin using tube tomorrow morning  Signed,   Arnold Depinto S. Letecia Arps, DO   

## 2021-01-11 NOTE — Sedation Documentation (Signed)
Transported patient to [address]. Had respiratory meet this RN at the bedside due to decreased oxygen sats in the low 80s. Suctioned patient prior to transport. Gave RN Davy Pique report at the bedside and told her about the antibiotic that needed to be given, she said she would give it. This RN notified her that it needed to be given before 1030 AM. Patient suctioned by RRT and sats increased to mid 90s.

## 2021-01-11 NOTE — Progress Notes (Signed)
Pulmonary Critical Care Medicine [place]   PULMONARY CRITICAL CARE SERVICE  PROGRESS NOTE  Date of Service: 01/11/2021  Jamie Young  [id number]:[id number]  DOB: 1946-10-23   DOA: 12/18/2020  Referring Physician: Merton Border, MD  HPI: Jamie Young is a 75 y.o. male seen for follow up of Acute on Chronic Respiratory Failure.  Patient is on T collar currently on 20% FiO2 reportedly still with copious amounts of secretions  Medications: Reviewed on Rounds  Physical Exam:  Vitals: Temperature is 96.0 pulse 82 respiratory 19 blood pressure is 123/52 saturations 100%  Ventilator Settings on T collar FiO2 28%  . General: Comfortable at this time . Eyes: Grossly normal lids, irises & conjunctiva . ENT: grossly tongue is normal . Neck: no obvious mass . Cardiovascular: S1 S2 normal no gallop . Respiratory: Scattered rhonchi expansion is equal . Abdomen: soft . Skin: no rash seen on limited exam . Musculoskeletal: not rigid . Psychiatric:unable to assess . Neurologic: no seizure no involuntary movements         Lab Data:   Basic Metabolic Panel: Recent Labs  Lab 01/05/21 0422  NA 144  K 3.8  CL 106  CO2 30  GLUCOSE 170*  BUN 42*  CREATININE 0.42*  CALCIUM 9.9    ABG: No results for input(s): PHART, PCO2ART, PO2ART, HCO3, O2SAT in the last 168 hours.  Liver Function Tests: No results for input(s): AST, ALT, ALKPHOS, BILITOT, PROT, ALBUMIN in the last 168 hours. No results for input(s): LIPASE, AMYLASE in the last 168 hours. No results for input(s): AMMONIA in the last 168 hours.  CBC: Recent Labs  Lab 01/05/21 0422  WBC 12.5*  HGB 11.2*  HCT 36.3*  MCV 97.6  PLT 219    Cardiac Enzymes: No results for input(s): CKTOTAL, CKMB, CKMBINDEX, TROPONINI in the last 168 hours.  BNP (last 3 results) No results for input(s): BNP in the last 8760 hours.  ProBNP (last 3 results) No results for input(s): PROBNP in the last 8760  hours.  Radiological Exams: No results found.  Assessment/Plan Active Problems:   NSCLC of left lung (HCC)   Acute on chronic respiratory failure with hypoxia (HCC)   Acute lower GI bleeding   COVID-19 virus infection   COPD, severe ([place])   1. Acute on chronic respiratory failure hypoxia we will continue T collar trials titrate as tolerated continue pulmonary toilet. 2. 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 GI bleed no active bleeding 4. COVID-19 virus infection recovery 5. Severe COPD medical management   I have personally seen and evaluated the patient, evaluated laboratory and imaging results, formulated the assessment and plan and placed orders. The Patient requires high complexity decision making with multiple systems involvement.  Rounds were done with the Respiratory Therapy Director and Staff therapists and discussed with nursing staff also.  Allyne Gee, MD [facility] Pulmonary Critical Care Medicine Sleep Medicine

## 2021-01-12 DIAGNOSIS — J9621 Acute and chronic respiratory failure with hypoxia: Secondary | ICD-10-CM | POA: Diagnosis not present

## 2021-01-12 DIAGNOSIS — K922 Gastrointestinal hemorrhage, unspecified: Secondary | ICD-10-CM | POA: Diagnosis not present

## 2021-01-12 DIAGNOSIS — U071 COVID-19: Secondary | ICD-10-CM | POA: Diagnosis not present

## 2021-01-12 DIAGNOSIS — J449 Chronic obstructive pulmonary disease, unspecified: Secondary | ICD-10-CM | POA: Diagnosis not present

## 2021-01-12 LAB — CBC
HCT: 42.3 % (ref 39.0–52.0)
Hemoglobin: 12 g/dL — ABNORMAL LOW (ref 13.0–17.0)
MCH: 28.4 pg (ref 26.0–34.0)
MCHC: 28.4 g/dL — ABNORMAL LOW (ref 30.0–36.0)
MCV: 100.2 fL — ABNORMAL HIGH (ref 80.0–100.0)
Platelets: 320 10*3/uL (ref 150–400)
RBC: 4.22 MIL/uL (ref 4.22–5.81)
RDW: 16.5 % — ABNORMAL HIGH (ref 11.5–15.5)
WBC: 14.5 10*3/uL — ABNORMAL HIGH (ref 4.0–10.5)
nRBC: 0 % (ref 0.0–0.2)

## 2021-01-12 LAB — BASIC METABOLIC PANEL
Anion gap: 9 (ref 5–15)
BUN: 36 mg/dL — ABNORMAL HIGH (ref 8–23)
CO2: 26 mmol/L (ref 22–32)
Calcium: 9.7 mg/dL (ref 8.9–10.3)
Chloride: 109 mmol/L (ref 98–111)
Creatinine, Ser: 0.38 mg/dL — ABNORMAL LOW (ref 0.61–1.24)
GFR, Estimated: >60 mL/min (ref >60)
Glucose, Bld: 157 mg/dL — ABNORMAL HIGH (ref 70–99)
Potassium: 4.2 mmol/L (ref 3.5–5.1)
Sodium: 144 mmol/L (ref 135–145)

## 2021-01-12 LAB — PROTIME-INR
INR: 1.2 (ref 0.8–1.2)
Prothrombin Time: 14.7 seconds (ref 11.4–15.2)

## 2021-01-12 LAB — HEPARIN LEVEL (UNFRACTIONATED)
Heparin Unfractionated: 0.21 IU/mL — ABNORMAL LOW (ref 0.30–0.70)
Heparin Unfractionated: 0.23 IU/mL — ABNORMAL LOW (ref 0.30–0.70)
Heparin Unfractionated: 0.38 IU/mL (ref 0.30–0.70)
Heparin Unfractionated: 0.46 IU/mL (ref 0.30–0.70)

## 2021-01-12 NOTE — Progress Notes (Signed)
   Percutaneous Gtube placed in IR 2/14  Site is clean and dry NT no bleeding +BS  May use now

## 2021-01-12 NOTE — Progress Notes (Signed)
Pulmonary Critical Care Medicine [place]   PULMONARY CRITICAL CARE SERVICE  PROGRESS NOTE  Date of Service: 01/12/2021  Jamie Young  [id number]:[id number]  DOB: [birth year]   DOA: 12/18/2020  Referring Physician: Merton Border, MD  HPI: Jamie Young is a 75 y.o. male seen for follow up of Acute on Chronic Respiratory Failure.  Patient is on T collar appears to be comfortable right now without distress secretions are still quite copious  Medications: Reviewed on Rounds  Physical Exam:  Vitals: Temperature 96.4 pulse 87 respiratory 26 blood pressure is 168/69 saturations 98%  Ventilator Settings on T collar FiO2 35%  . General: Comfortable at this time . Eyes: Grossly normal lids, irises & conjunctiva . ENT: grossly tongue is normal . Neck: no obvious mass . Cardiovascular: S1 S2 normal no gallop . Respiratory: No rhonchi very coarse percent . Abdomen: soft . Skin: no rash seen on limited exam . Musculoskeletal: not rigid . Psychiatric:unable to assess . Neurologic: no seizure no involuntary movements         Lab Data:   Basic Metabolic Panel: Recent Labs  Lab 01/12/21 0403  NA 144  K 4.2  CL 109  CO2 26  GLUCOSE 157*  BUN 36*  CREATININE 0.38*  CALCIUM 9.7    ABG: No results for input(s): PHART, PCO2ART, PO2ART, HCO3, O2SAT in the last 168 hours.  Liver Function Tests: No results for input(s): AST, ALT, ALKPHOS, BILITOT, PROT, ALBUMIN in the last 168 hours. No results for input(s): LIPASE, AMYLASE in the last 168 hours. No results for input(s): AMMONIA in the last 168 hours.  CBC: Recent Labs  Lab 01/11/21 1656 01/12/21 0403  WBC 14.4* 14.5*  HGB 12.6* 12.0*  HCT 41.8 42.3  MCV 96.3 100.2*  PLT 357 320    Cardiac Enzymes: No results for input(s): CKTOTAL, CKMB, CKMBINDEX, TROPONINI in the last 168 hours.  BNP (last 3 results) No results for input(s): BNP in the last 8760 hours.  ProBNP (last 3 results) No results for  input(s): PROBNP in the last 8760 hours.  Radiological Exams: IR GASTROSTOMY TUBE MOD SED  Result Date: 01/11/2021 INDICATION: 75 year old male referred for gastrostomy placement EXAM: PERC PLACEMENT GASTROSTOMY MEDICATIONS: 1 g vancomycin; Antibiotics were administered within 1 hour of the procedure. ANESTHESIA/SEDATION: Versed 1.0 mg IV; Fentanyl 25 mcg IV Moderate Sedation Time:  10 minutes The patient was continuously monitored during the procedure by the interventional radiology nurse under my direct supervision. CONTRAST:  42mL OMNIPAQUE IOHEXOL 300 MG/ML SOLN - administered into the gastric lumen. FLUOROSCOPY TIME:  Fluoroscopy Time: 1 minutes 24 seconds (6 mGy). COMPLICATIONS: None PROCEDURE: Informed written consent was obtained from the patient and the patient's family after a thorough discussion of the procedural risks, benefits and alternatives. All questions were addressed. Maximal Sterile Barrier Technique was utilized including caps, mask, sterile gowns, sterile gloves, sterile drape, hand hygiene and skin antiseptic. A timeout was performed prior to the initiation of the procedure. The epigastrium was prepped with Betadine in a sterile fashion, and a sterile drape was applied covering the operative field. A sterile gown and sterile gloves were used for the procedure. A 5-French orogastric tube is placed under fluoroscopic guidance. Scout imaging of the abdomen confirms barium within the transverse colon. The stomach was distended with gas. Under fluoroscopic guidance, an 18 gauge needle was utilized to puncture the anterior wall of the body of the stomach. An Amplatz wire was advanced through the needle passing a T  fastener into the lumen of the stomach. The T fastener was secured for gastropexy. A 9-French sheath was inserted. A snare was advanced through the 9-French sheath. A Britta Mccreedy was advanced through the orogastric tube. It was snared then pulled out the oral cavity, pulling the snare, as  well. The leading edge of the gastrostomy was attached to the snare. It was then pulled down the esophagus and out the percutaneous site. Tube secured in place. Contrast was injected. Patient tolerated the procedure well and remained hemodynamically stable throughout. No complications were encountered and no significant blood loss encountered. IMPRESSION: Status post fluoroscopic placed percutaneous gastrostomy tube, with 20 [place] pull-through. Signed, Dulcy Fanny. Earleen Newport, DO [facility] Radiology Electronically Signed   By: Corrie Mckusick D.O.   On: 01/11/2021 11:10    Assessment/Plan Active Problems:   NSCLC of left lung (HCC)   Acute on chronic respiratory failure with hypoxia (HCC)   Acute lower GI bleeding   COVID-19 virus infection   COPD, severe ([place])   1. Acute on chronic respiratory failure hypoxia we will continue with the T collar titrate oxygen continue pulmonary toilet 2. Non-small cell cancer on supportive care 3. Acute GI bleed no change 4. COVID-19 virus infection recovery 5. Severe COPD patient is at baseline   I have personally seen and evaluated the patient, evaluated laboratory and imaging results, formulated the assessment and plan and placed orders. The Patient requires high complexity decision making with multiple systems involvement.  Rounds were done with the Respiratory Therapy Director and Staff therapists and discussed with nursing staff also.  Allyne Gee, MD [facility] Pulmonary Critical Care Medicine Sleep Medicine

## 2021-01-13 DIAGNOSIS — J449 Chronic obstructive pulmonary disease, unspecified: Secondary | ICD-10-CM | POA: Diagnosis not present

## 2021-01-13 DIAGNOSIS — U071 COVID-19: Secondary | ICD-10-CM | POA: Diagnosis not present

## 2021-01-13 DIAGNOSIS — J9621 Acute and chronic respiratory failure with hypoxia: Secondary | ICD-10-CM | POA: Diagnosis not present

## 2021-01-13 DIAGNOSIS — K922 Gastrointestinal hemorrhage, unspecified: Secondary | ICD-10-CM | POA: Diagnosis not present

## 2021-01-13 LAB — PROTIME-INR
INR: 1.2 (ref 0.8–1.2)
Prothrombin Time: 15.1 seconds (ref 11.4–15.2)

## 2021-01-13 LAB — HEPARIN LEVEL (UNFRACTIONATED): Heparin Unfractionated: 0.46 IU/mL (ref 0.30–0.70)

## 2021-01-13 NOTE — Progress Notes (Signed)
Pulmonary Critical Care Medicine [place]   PULMONARY CRITICAL CARE SERVICE  PROGRESS NOTE  Date of Service: 01/13/2021  TOMISLAV MICALE  RFF:[id number]  DOB: 1945/12/13   DOA: 12/18/2020  Referring Physician: Merton Border, MD  HPI: Jamie Young is a 75 y.o. male seen for follow up of Acute on Chronic Respiratory Failure.  Patient currently is on T collar on 35% FiO2 good saturations are noted  Medications: Reviewed on Rounds  Physical Exam:  Vitals: Temperature is 97.3 pulse 85 respiratory rate 20 blood pressure is 108/63 saturations 100%  Ventilator Settings on T collar FiO2 35%  . General: Comfortable at this time . Eyes: Grossly normal lids, irises & conjunctiva . ENT: grossly tongue is normal . Neck: no obvious mass . Cardiovascular: S1 S2 normal no gallop . Respiratory: Scattered rhonchi expansion is equal . Abdomen: soft . Skin: no rash seen on limited exam . Musculoskeletal: not rigid . Psychiatric:unable to assess . Neurologic: no seizure no involuntary movements         Lab Data:   Basic Metabolic Panel: Recent Labs  Lab 01/12/21 0403  NA 144  K 4.2  CL 109  CO2 26  GLUCOSE 157*  BUN 36*  CREATININE 0.38*  CALCIUM 9.7    ABG: No results for input(s): PHART, PCO2ART, PO2ART, HCO3, O2SAT in the last 168 hours.  Liver Function Tests: No results for input(s): AST, ALT, ALKPHOS, BILITOT, PROT, ALBUMIN in the last 168 hours. No results for input(s): LIPASE, AMYLASE in the last 168 hours. No results for input(s): AMMONIA in the last 168 hours.  CBC: Recent Labs  Lab 01/11/21 1656 01/12/21 0403  WBC 14.4* 14.5*  HGB 12.6* 12.0*  HCT 41.8 42.3  MCV 96.3 100.2*  PLT 357 320    Cardiac Enzymes: No results for input(s): CKTOTAL, CKMB, CKMBINDEX, TROPONINI in the last 168 hours.  BNP (last 3 results) No results for input(s): BNP in the last 8760 hours.  ProBNP (last 3 results) No results for input(s): PROBNP in the last  8760 hours.  Radiological Exams: No results found.  Assessment/Plan Active Problems:   NSCLC of left lung (HCC)   Acute on chronic respiratory failure with hypoxia (HCC)   Acute lower GI bleeding   COVID-19 virus infection   COPD, severe ([place])   1. Acute on chronic respiratory failure hypoxia we will continue with T collar on 35% FiO2 titrate as tolerated continue secretion management 2. COVID-19 virus infection requiring 3. Severe COPD medical management   I have personally seen and evaluated the patient, evaluated laboratory and imaging results, formulated the assessment and plan and placed orders. The Patient requires high complexity decision making with multiple systems involvement.  Rounds were done with the Respiratory Therapy Director and Staff therapists and discussed with nursing staff also.  Allyne Gee, MD [facility] Pulmonary Critical Care Medicine Sleep Medicine

## 2021-01-14 DIAGNOSIS — J9621 Acute and chronic respiratory failure with hypoxia: Secondary | ICD-10-CM | POA: Diagnosis not present

## 2021-01-14 DIAGNOSIS — U071 COVID-19: Secondary | ICD-10-CM | POA: Diagnosis not present

## 2021-01-14 DIAGNOSIS — K922 Gastrointestinal hemorrhage, unspecified: Secondary | ICD-10-CM | POA: Diagnosis not present

## 2021-01-14 DIAGNOSIS — J449 Chronic obstructive pulmonary disease, unspecified: Secondary | ICD-10-CM | POA: Diagnosis not present

## 2021-01-14 LAB — PROTIME-INR
INR: 1.7 — ABNORMAL HIGH (ref 0.8–1.2)
Prothrombin Time: 19.6 seconds — ABNORMAL HIGH (ref 11.4–15.2)

## 2021-01-14 LAB — HEPARIN LEVEL (UNFRACTIONATED): Heparin Unfractionated: 0.39 IU/mL (ref 0.30–0.70)

## 2021-01-14 NOTE — Progress Notes (Signed)
Pulmonary Critical Care Medicine [place]   PULMONARY CRITICAL CARE SERVICE  PROGRESS NOTE  Date of Service: 01/14/2021  Jamie Young  UKG:[id number]  DOB: 02-May-1946   DOA: 12/18/2020  Referring Physician: Merton Border, MD  HPI: Jamie Young is a 75 y.o. male seen for follow up of Acute on Chronic Respiratory Failure.  Patient is on T collar currently on 40% FiO2 good saturations are noted  Medications: Reviewed on Rounds  Physical Exam:  Vitals: Temperature is 96.6 pulse 89 respiratory rate is 28 blood pressure is 113/55 saturations 96%  Ventilator Settings on T collar with an FiO2 of 40%  . General: Comfortable at this time . Eyes: Grossly normal lids, irises & conjunctiva . ENT: grossly tongue is normal . Neck: no obvious mass . Cardiovascular: S1 S2 normal no gallop . Respiratory: Scattered rhonchi expansion equal . Abdomen: soft . Skin: no rash seen on limited exam . Musculoskeletal: not rigid . Psychiatric:unable to assess . Neurologic: no seizure no involuntary movements         Lab Data:   Basic Metabolic Panel: Recent Labs  Lab 01/12/21 0403  NA 144  K 4.2  CL 109  CO2 26  GLUCOSE 157*  BUN 36*  CREATININE 0.38*  CALCIUM 9.7    ABG: No results for input(s): PHART, PCO2ART, PO2ART, HCO3, O2SAT in the last 168 hours.  Liver Function Tests: No results for input(s): AST, ALT, ALKPHOS, BILITOT, PROT, ALBUMIN in the last 168 hours. No results for input(s): LIPASE, AMYLASE in the last 168 hours. No results for input(s): AMMONIA in the last 168 hours.  CBC: Recent Labs  Lab 01/11/21 1656 01/12/21 0403  WBC 14.4* 14.5*  HGB 12.6* 12.0*  HCT 41.8 42.3  MCV 96.3 100.2*  PLT 357 320    Cardiac Enzymes: No results for input(s): CKTOTAL, CKMB, CKMBINDEX, TROPONINI in the last 168 hours.  BNP (last 3 results) No results for input(s): BNP in the last 8760 hours.  ProBNP (last 3 results) No results for input(s): PROBNP  in the last 8760 hours.  Radiological Exams: No results found.  Assessment/Plan Active Problems:   NSCLC of left lung (HCC)   Acute on chronic respiratory failure with hypoxia (HCC)   Acute lower GI bleeding   COVID-19 virus infection   COPD, severe ([place])   1. Acute on chronic respiratory failure hypoxia we will continue with T collar trials titrate oxygen continue pulmonary toilet. 2. Increased tracheal secretions patient has copious secretions limiting [place] from being able to wean. 3. Acute GI bleed no change we will continue to follow 4. Non-small cell cancer of the lung supportive care 5. COVID-19 virus infection recovery 6. Severe COPD at baseline   I have personally seen and evaluated the patient, evaluated laboratory and imaging results, formulated the assessment and plan and placed orders. The Patient requires high complexity decision making with multiple systems involvement.  Rounds were done with the Respiratory Therapy Director and Staff therapists and discussed with nursing staff also.  Allyne Gee, MD [facility] Pulmonary Critical Care Medicine Sleep Medicine

## 2021-01-15 DIAGNOSIS — J9621 Acute and chronic respiratory failure with hypoxia: Secondary | ICD-10-CM | POA: Diagnosis not present

## 2021-01-15 DIAGNOSIS — J449 Chronic obstructive pulmonary disease, unspecified: Secondary | ICD-10-CM | POA: Diagnosis not present

## 2021-01-15 DIAGNOSIS — U071 COVID-19: Secondary | ICD-10-CM | POA: Diagnosis not present

## 2021-01-15 DIAGNOSIS — K922 Gastrointestinal hemorrhage, unspecified: Secondary | ICD-10-CM | POA: Diagnosis not present

## 2021-01-15 LAB — CBC
HCT: 35.3 % — ABNORMAL LOW (ref 39.0–52.0)
Hemoglobin: 10.5 g/dL — ABNORMAL LOW (ref 13.0–17.0)
MCH: 28.1 pg (ref 26.0–34.0)
MCHC: 29.7 g/dL — ABNORMAL LOW (ref 30.0–36.0)
MCV: 94.4 fL (ref 80.0–100.0)
Platelets: 276 10*3/uL (ref 150–400)
RBC: 3.74 MIL/uL — ABNORMAL LOW (ref 4.22–5.81)
RDW: 16.9 % — ABNORMAL HIGH (ref 11.5–15.5)
WBC: 10 10*3/uL (ref 4.0–10.5)
nRBC: 0 % (ref 0.0–0.2)

## 2021-01-15 LAB — BASIC METABOLIC PANEL WITH GFR
Anion gap: 9 (ref 5–15)
BUN: 30 mg/dL — ABNORMAL HIGH (ref 8–23)
CO2: 28 mmol/L (ref 22–32)
Calcium: 9.3 mg/dL (ref 8.9–10.3)
Chloride: 102 mmol/L (ref 98–111)
Creatinine, Ser: 0.44 mg/dL — ABNORMAL LOW (ref 0.61–1.24)
GFR, Estimated: >60 mL/min
Glucose, Bld: 161 mg/dL — ABNORMAL HIGH (ref 70–99)
Potassium: 4.1 mmol/L (ref 3.5–5.1)
Sodium: 139 mmol/L (ref 135–145)

## 2021-01-15 LAB — PROTIME-INR
INR: 2.2 — ABNORMAL HIGH (ref 0.8–1.2)
Prothrombin Time: 23.4 seconds — ABNORMAL HIGH (ref 11.4–15.2)

## 2021-01-15 NOTE — Progress Notes (Signed)
Pulmonary Critical Care Medicine [place]   PULMONARY CRITICAL CARE SERVICE  PROGRESS NOTE  Date of Service: 01/15/2021  CHAYTON MURATA  TIW:[id number]  DOB: 15-Nov-1946   DOA: 12/18/2020  Referring Physician: Merton Border, MD  HPI: VIBHAV WADDILL is a 75 y.o. male seen for follow up of Acute on Chronic Respiratory Failure.  Patient currently is on T collar has been on 35% FiO2  Medications: Reviewed on Rounds  Physical Exam:  Vitals: Temperature 97.0 pulse 88 respiratory rate 22 blood pressure is 104/68 saturations 100%  Ventilator Settings on T collar with an FiO2 of 35%  . General: Comfortable at this time . Eyes: Grossly normal lids, irises & conjunctiva . ENT: grossly tongue is normal . Neck: no obvious mass . Cardiovascular: S1 S2 normal no gallop . Respiratory: Scattered rhonchi expansion is equal . Abdomen: soft . Skin: no rash seen on limited exam . Musculoskeletal: not rigid . Psychiatric:unable to assess . Neurologic: no seizure no involuntary movements         Lab Data:   Basic Metabolic Panel: Recent Labs  Lab 01/12/21 0403 01/15/21 0450  NA 144 139  K 4.2 4.1  CL 109 102  CO2 26 28  GLUCOSE 157* 161*  BUN 36* 30*  CREATININE 0.38* 0.44*  CALCIUM 9.7 9.3    ABG: No results for input(s): PHART, PCO2ART, PO2ART, HCO3, O2SAT in the last 168 hours.  Liver Function Tests: No results for input(s): AST, ALT, ALKPHOS, BILITOT, PROT, ALBUMIN in the last 168 hours. No results for input(s): LIPASE, AMYLASE in the last 168 hours. No results for input(s): AMMONIA in the last 168 hours.  CBC: Recent Labs  Lab 01/11/21 1656 01/12/21 0403 01/15/21 0450  WBC 14.4* 14.5* 10.0  HGB 12.6* 12.0* 10.5*  HCT 41.8 42.3 35.3*  MCV 96.3 100.2* 94.4  PLT 357 320 276    Cardiac Enzymes: No results for input(s): CKTOTAL, CKMB, CKMBINDEX, TROPONINI in the last 168 hours.  BNP (last 3 results) No results for input(s): BNP in the last  8760 hours.  ProBNP (last 3 results) No results for input(s): PROBNP in the last 8760 hours.  Radiological Exams: No results found.  Assessment/Plan Active Problems:   NSCLC of left lung (HCC)   Acute on chronic respiratory failure with hypoxia (HCC)   Acute lower GI bleeding   COVID-19 virus infection   COPD, severe ([place])   1. Acute on chronic respiratory failure hypoxia we will continue T collar trials patient is on 35% FiO2 secretions are copious limiting factor for weaning 2. GI bleed no sign of active bleeding 3. 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 COVID-19 virus infection recovery 5. Severe COPD medical management   I have personally seen and evaluated the patient, evaluated laboratory and imaging results, formulated the assessment and plan and placed orders. The Patient requires high complexity decision making with multiple systems involvement.  Rounds were done with the Respiratory Therapy Director and Staff therapists and discussed with nursing staff also.  Allyne Gee, MD [facility] Pulmonary Critical Care Medicine Sleep Medicine

## 2021-01-16 DIAGNOSIS — J9621 Acute and chronic respiratory failure with hypoxia: Secondary | ICD-10-CM | POA: Diagnosis not present

## 2021-01-16 DIAGNOSIS — J449 Chronic obstructive pulmonary disease, unspecified: Secondary | ICD-10-CM | POA: Diagnosis not present

## 2021-01-16 DIAGNOSIS — K922 Gastrointestinal hemorrhage, unspecified: Secondary | ICD-10-CM | POA: Diagnosis not present

## 2021-01-16 DIAGNOSIS — U071 COVID-19: Secondary | ICD-10-CM | POA: Diagnosis not present

## 2021-01-16 LAB — PROTIME-INR
INR: 2.4 — ABNORMAL HIGH (ref 0.8–1.2)
Prothrombin Time: 25.2 seconds — ABNORMAL HIGH (ref 11.4–15.2)

## 2021-01-16 NOTE — Progress Notes (Signed)
Pulmonary Critical Care Medicine [place]   PULMONARY CRITICAL CARE SERVICE  PROGRESS NOTE  Date of Service: 01/16/2021  Jamie Young  XNA:[id number]  DOB: May 14, 1946   DOA: 12/18/2020  Referring Physician: Merton Border, MD  HPI: Jamie Young is a 75 y.o. male seen for follow up of Acute on Chronic Respiratory Failure.  Patient is on T collar on 28% FiO2 using the PMV  Medications: Reviewed on Rounds  Physical Exam:  Vitals: Temperature is 96.4 pulse 84 respiratory 24 blood pressure is 137/61 saturations 99%  Ventilator Settings on T collar with an FiO2 of 28%  . General: Comfortable at this time . Eyes: Grossly normal lids, irises & conjunctiva . ENT: grossly tongue is normal . Neck: no obvious mass . Cardiovascular: S1 S2 normal no gallop . Respiratory: Scattered rhonchi expansion is equal . Abdomen: soft . Skin: no rash seen on limited exam . Musculoskeletal: not rigid . Psychiatric:unable to assess . Neurologic: no seizure no involuntary movements         Lab Data:   Basic Metabolic Panel: Recent Labs  Lab 01/12/21 0403 01/15/21 0450  NA 144 139  K 4.2 4.1  CL 109 102  CO2 26 28  GLUCOSE 157* 161*  BUN 36* 30*  CREATININE 0.38* 0.44*  CALCIUM 9.7 9.3    ABG: No results for input(s): PHART, PCO2ART, PO2ART, HCO3, O2SAT in the last 168 hours.  Liver Function Tests: No results for input(s): AST, ALT, ALKPHOS, BILITOT, PROT, ALBUMIN in the last 168 hours. No results for input(s): LIPASE, AMYLASE in the last 168 hours. No results for input(s): AMMONIA in the last 168 hours.  CBC: Recent Labs  Lab 01/11/21 1656 01/12/21 0403 01/15/21 0450  WBC 14.4* 14.5* 10.0  HGB 12.6* 12.0* 10.5*  HCT 41.8 42.3 35.3*  MCV 96.3 100.2* 94.4  PLT 357 320 276    Cardiac Enzymes: No results for input(s): CKTOTAL, CKMB, CKMBINDEX, TROPONINI in the last 168 hours.  BNP (last 3 results) No results for input(s): BNP in the last 8760  hours.  ProBNP (last 3 results) No results for input(s): PROBNP in the last 8760 hours.  Radiological Exams: No results found.  Assessment/Plan Active Problems:   NSCLC of left lung (HCC)   Acute on chronic respiratory failure with hypoxia (HCC)   Acute lower GI bleeding   COVID-19 virus infection   COPD, severe ([place])   1. Acute on chronic respiratory failure hypoxia we will continue with the T collar patient has copious secretions so therefore not able to wean 2. 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 Acute GI bleed no sign of active bleeding. 4. COVID-19 virus infection recovery phase 5. Severe COPD medical management   I have personally seen and evaluated the patient, evaluated laboratory and imaging results, formulated the assessment and plan and placed orders. The Patient requires high complexity decision making with multiple systems involvement.  Rounds were done with the Respiratory Therapy Director and Staff therapists and discussed with nursing staff also.  Allyne Gee, MD [facility] Pulmonary Critical Care Medicine Sleep Medicine

## 2021-01-17 DIAGNOSIS — U071 COVID-19: Secondary | ICD-10-CM | POA: Diagnosis not present

## 2021-01-17 DIAGNOSIS — J449 Chronic obstructive pulmonary disease, unspecified: Secondary | ICD-10-CM | POA: Diagnosis not present

## 2021-01-17 DIAGNOSIS — K922 Gastrointestinal hemorrhage, unspecified: Secondary | ICD-10-CM | POA: Diagnosis not present

## 2021-01-17 DIAGNOSIS — J9621 Acute and chronic respiratory failure with hypoxia: Secondary | ICD-10-CM | POA: Diagnosis not present

## 2021-01-17 LAB — PROTIME-INR
INR: 2.5 — ABNORMAL HIGH (ref 0.8–1.2)
Prothrombin Time: 26.1 seconds — ABNORMAL HIGH (ref 11.4–15.2)

## 2021-01-17 NOTE — Progress Notes (Signed)
Pulmonary Critical Care Medicine [place]   PULMONARY CRITICAL CARE SERVICE  PROGRESS NOTE  Date of Service: 01/17/2021  Jamie Young  [id number]  DOB: [birth year]   DOA: 12/18/2020  Referring Physician: Merton Border, MD  HPI: Jamie Young is a 75 y.o. male seen for follow up of Acute on Chronic Respiratory Failure.  Patient is T collar at this time comfortable right now without distress should be able to try capping trial  Medications: Reviewed on Rounds  Physical Exam:  Vitals: Temperature 96.9 pulse 89 respiratory rate is 21 blood pressure is 136/90 saturations 98%  Ventilator Settings on T collar with an FiO2 of 28%  . General: Comfortable at this time . Eyes: Grossly normal lids, irises & conjunctiva . ENT: grossly tongue is normal . Neck: no obvious mass . Cardiovascular: S1 S2 normal no gallop . Respiratory: No rhonchi no rales noted at this time . Abdomen: soft . Skin: no rash seen on limited exam . Musculoskeletal: not rigid . Psychiatric:unable to assess . Neurologic: no seizure no involuntary movements         Lab Data:   Basic Metabolic Panel: Recent Labs  Lab 01/12/21 0403 01/15/21 0450  NA 144 139  K 4.2 4.1  CL 109 102  CO2 26 28  GLUCOSE 157* 161*  BUN 36* 30*  CREATININE 0.38* 0.44*  CALCIUM 9.7 9.3    ABG: No results for input(s): PHART, PCO2ART, PO2ART, HCO3, O2SAT in the last 168 hours.  Liver Function Tests: No results for input(s): AST, ALT, ALKPHOS, BILITOT, PROT, ALBUMIN in the last 168 hours. No results for input(s): LIPASE, AMYLASE in the last 168 hours. No results for input(s): AMMONIA in the last 168 hours.  CBC: Recent Labs  Lab 01/11/21 1656 01/12/21 0403 01/15/21 0450  WBC 14.4* 14.5* 10.0  HGB 12.6* 12.0* 10.5*  HCT 41.8 42.3 35.3*  MCV 96.3 100.2* 94.4  PLT 357 320 276    Cardiac Enzymes: No results for input(s): CKTOTAL, CKMB, CKMBINDEX, TROPONINI in the last 168 hours.  BNP  (last 3 results) No results for input(s): BNP in the last 8760 hours.  ProBNP (last 3 results) No results for input(s): PROBNP in the last 8760 hours.  Radiological Exams: No results found.  Assessment/Plan Active Problems:   NSCLC of left lung (HCC)   Acute on chronic respiratory failure with hypoxia (HCC)   Acute lower GI bleeding   COVID-19 virus infection   COPD, severe ([place])   1. Acute on chronic respiratory failure hypoxia we will continue T collar trials titrate oxygen continue pulmonary toilet. 2. Non-small cell cancer of the lung supportive care 3. Acute GI bleed patient's baseline 4. COVID-19 virus infection recovery 5. Severe COPD medical management   I have personally seen and evaluated the patient, evaluated laboratory and imaging results, formulated the assessment and plan and placed orders. The Patient requires high complexity decision making with multiple systems involvement.  Rounds were done with the Respiratory Therapy Director and Staff therapists and discussed with nursing staff also.  Allyne Gee, MD [facility] Pulmonary Critical Care Medicine Sleep Medicine

## 2021-01-18 DIAGNOSIS — U071 COVID-19: Secondary | ICD-10-CM | POA: Diagnosis not present

## 2021-01-18 DIAGNOSIS — J9621 Acute and chronic respiratory failure with hypoxia: Secondary | ICD-10-CM | POA: Diagnosis not present

## 2021-01-18 DIAGNOSIS — J449 Chronic obstructive pulmonary disease, unspecified: Secondary | ICD-10-CM | POA: Diagnosis not present

## 2021-01-18 DIAGNOSIS — K922 Gastrointestinal hemorrhage, unspecified: Secondary | ICD-10-CM | POA: Diagnosis not present

## 2021-01-18 LAB — PROTIME-INR
INR: 2.1 — ABNORMAL HIGH (ref 0.8–1.2)
Prothrombin Time: 22.7 seconds — ABNORMAL HIGH (ref 11.4–15.2)

## 2021-01-18 NOTE — Progress Notes (Signed)
Pulmonary Critical Care Medicine [place]   PULMONARY CRITICAL CARE SERVICE  PROGRESS NOTE  Date of Service: 01/18/2021  [name]  [id number]  DOB: 1946-10-07   DOA: 12/18/2020  Referring Physician: Merton Border, MD  HPI: Jamie Young is a 75 y.o. male seen for follow up of Acute on Chronic Respiratory Failure.  Patient currently is on T collar has been on 28% FiO2  Medications: Reviewed on Rounds  Physical Exam:  Vitals: Temperature is 96.5 pulse 89 respiratory rate is 15 blood pressure is 120/67 saturations 100%  Ventilator Settings off the vent on T collar  . General: Comfortable at this time . Eyes: Grossly normal lids, irises & conjunctiva . ENT: grossly tongue is normal . Neck: no obvious mass . Cardiovascular: S1 S2 normal no gallop . Respiratory: Scattered rhonchi expansion is equal at this time . Abdomen: soft . Skin: no rash seen on limited exam . Musculoskeletal: not rigid . Psychiatric:unable to assess . Neurologic: no seizure no involuntary movements         Lab Data:   Basic Metabolic Panel: Recent Labs  Lab 01/12/21 0403 01/15/21 0450  NA 144 139  K 4.2 4.1  CL 109 102  CO2 26 28  GLUCOSE 157* 161*  BUN 36* 30*  CREATININE 0.38* 0.44*  CALCIUM 9.7 9.3    ABG: No results for input(s): PHART, PCO2ART, PO2ART, HCO3, O2SAT in the last 168 hours.  Liver Function Tests: No results for input(s): AST, ALT, ALKPHOS, BILITOT, PROT, ALBUMIN in the last 168 hours. No results for input(s): LIPASE, AMYLASE in the last 168 hours. No results for input(s): AMMONIA in the last 168 hours.  CBC: Recent Labs  Lab 01/11/21 1656 01/12/21 0403 01/15/21 0450  WBC 14.4* 14.5* 10.0  HGB 12.6* 12.0* 10.5*  HCT 41.8 42.3 35.3*  MCV 96.3 100.2* 94.4  PLT 357 320 276    Cardiac Enzymes: No results for input(s): CKTOTAL, CKMB, CKMBINDEX, TROPONINI in the last 168 hours.  BNP (last 3 results) No results for input(s): BNP in  the last 8760 hours.  ProBNP (last 3 results) No results for input(s): PROBNP in the last 8760 hours.  Radiological Exams: No results found.  Assessment/Plan Active Problems:   NSCLC of left lung (HCC)   Acute on chronic respiratory failure with hypoxia (HCC)   Acute lower GI bleeding   COVID-19 virus infection   COPD, severe ([place])   1. Acute on chronic respiratory failure hypoxia continue with the T-piece patient still with copious secretions so therefore not able to do much more low terms of weaning 2. 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 Acute GI bleed no sign of active bleed 4. COVID-19 virus infection recovery 5. Severe COPD medical management   I have personally seen and evaluated the patient, evaluated laboratory and imaging results, formulated the assessment and plan and placed orders. The Patient requires high complexity decision making with multiple systems involvement.  Rounds were done with the Respiratory Therapy Director and Staff therapists and discussed with nursing staff also.  Allyne Gee, MD [facility] Pulmonary Critical Care Medicine Sleep Medicine

## 2021-01-19 DIAGNOSIS — K922 Gastrointestinal hemorrhage, unspecified: Secondary | ICD-10-CM | POA: Diagnosis not present

## 2021-01-19 DIAGNOSIS — U071 COVID-19: Secondary | ICD-10-CM | POA: Diagnosis not present

## 2021-01-19 DIAGNOSIS — J9621 Acute and chronic respiratory failure with hypoxia: Secondary | ICD-10-CM | POA: Diagnosis not present

## 2021-01-19 DIAGNOSIS — J449 Chronic obstructive pulmonary disease, unspecified: Secondary | ICD-10-CM | POA: Diagnosis not present

## 2021-01-19 LAB — PROTIME-INR
INR: 2.1 — ABNORMAL HIGH (ref 0.8–1.2)
Prothrombin Time: 22.7 seconds — ABNORMAL HIGH (ref 11.4–15.2)

## 2021-01-19 NOTE — Progress Notes (Signed)
Pulmonary Critical Care Medicine [place]   PULMONARY CRITICAL CARE SERVICE  PROGRESS NOTE  Date of Service: 01/19/2021  Jamie Young  [id number]  DOB: 12-Dec-1945   DOA: 12/18/2020  Referring Physician: Merton Border, MD  HPI: Jamie Young is a 75 y.o. male seen for follow up of Acute on Chronic Respiratory Failure.  Patient is capping right now is on 1 L secretions are significantly better  Medications: Reviewed on Rounds  Physical Exam:  Vitals: Temperature is 96.6 pulse 80 respiratory rate is 15 blood pressure 131/58 saturations 100  Ventilator Settings capping on 1 L O2  . General: Comfortable at this time . Eyes: Grossly normal lids, irises & conjunctiva . ENT: grossly tongue is normal . Neck: no obvious mass . Cardiovascular: S1 S2 normal no gallop . Respiratory: No rhonchi very coarse breath sounds . Abdomen: soft . Skin: no rash seen on limited exam . Musculoskeletal: not rigid . Psychiatric:unable to assess . Neurologic: no seizure no involuntary movements         Lab Data:   Basic Metabolic Panel: Recent Labs  Lab 01/15/21 0450  NA 139  K 4.1  CL 102  CO2 28  GLUCOSE 161*  BUN 30*  CREATININE 0.44*  CALCIUM 9.3    ABG: No results for input(s): PHART, PCO2ART, PO2ART, HCO3, O2SAT in the last 168 hours.  Liver Function Tests: No results for input(s): AST, ALT, ALKPHOS, BILITOT, PROT, ALBUMIN in the last 168 hours. No results for input(s): LIPASE, AMYLASE in the last 168 hours. No results for input(s): AMMONIA in the last 168 hours.  CBC: Recent Labs  Lab 01/15/21 0450  WBC 10.0  HGB 10.5*  HCT 35.3*  MCV 94.4  PLT 276    Cardiac Enzymes: No results for input(s): CKTOTAL, CKMB, CKMBINDEX, TROPONINI in the last 168 hours.  BNP (last 3 results) No results for input(s): BNP in the last 8760 hours.  ProBNP (last 3 results) No results for input(s): PROBNP in the last 8760 hours.  Radiological Exams: No  results found.  Assessment/Plan Active Problems:   NSCLC of left lung (HCC)   Acute on chronic respiratory failure with hypoxia (HCC)   Acute lower GI bleeding   COVID-19 virus infection   COPD, severe ([place])   1. Acute on chronic respiratory failure hypoxia we will continue with capping trials as tolerated. 2. Non-small cell left lung supportive care 3. Acute GI bleed no further bleeding 4. COVID-19 virus infection recovery 5. Severe COPD medical management nebulizers as necessary   I have personally seen and evaluated the patient, evaluated laboratory and imaging results, formulated the assessment and plan and placed orders. The Patient requires high complexity decision making with multiple systems involvement.  Rounds were done with the Respiratory Therapy Director and Staff therapists and discussed with nursing staff also.  Allyne Gee, MD [facility] Pulmonary Critical Care Medicine Sleep Medicine

## 2021-01-20 DIAGNOSIS — J449 Chronic obstructive pulmonary disease, unspecified: Secondary | ICD-10-CM | POA: Diagnosis not present

## 2021-01-20 DIAGNOSIS — U071 COVID-19: Secondary | ICD-10-CM | POA: Diagnosis not present

## 2021-01-20 DIAGNOSIS — K922 Gastrointestinal hemorrhage, unspecified: Secondary | ICD-10-CM | POA: Diagnosis not present

## 2021-01-20 DIAGNOSIS — J9621 Acute and chronic respiratory failure with hypoxia: Secondary | ICD-10-CM | POA: Diagnosis not present

## 2021-01-20 LAB — PROTIME-INR
INR: 2.1 — ABNORMAL HIGH (ref 0.8–1.2)
Prothrombin Time: 22.6 seconds — ABNORMAL HIGH (ref 11.4–15.2)

## 2021-01-20 NOTE — Progress Notes (Signed)
Pulmonary Critical Care Medicine [place]   PULMONARY CRITICAL CARE SERVICE  PROGRESS NOTE  Date of Service: 01/20/2021  Jamie Young  [id number]  DOB: 1946-03-07   DOA: 12/18/2020  Referring Physician: Merton Border, MD  HPI: Jamie Young is a 75 y.o. male seen for follow up of Acute on Chronic Respiratory Failure.  Patient currently is capping he has been doing well completing 48 hours today  Medications: Reviewed on Rounds  Physical Exam:  Vitals: Temperature 97.0 pulse 86 respiratory 20 blood pressure is 128/58 saturations 100%  Ventilator Settings capping for 48 hours  . General: Comfortable at this time . Eyes: Grossly normal lids, irises & conjunctiva . ENT: grossly tongue is normal . Neck: no obvious mass . Cardiovascular: S1 S2 normal no gallop . Respiratory: Scattered rhonchi expansion is equal at this time . Abdomen: soft . Skin: no rash seen on limited exam . Musculoskeletal: not rigid . Psychiatric:unable to assess . Neurologic: no seizure no involuntary movements         Lab Data:   Basic Metabolic Panel: Recent Labs  Lab 01/15/21 0450  NA 139  K 4.1  CL 102  CO2 28  GLUCOSE 161*  BUN 30*  CREATININE 0.44*  CALCIUM 9.3    ABG: No results for input(s): PHART, PCO2ART, PO2ART, HCO3, O2SAT in the last 168 hours.  Liver Function Tests: No results for input(s): AST, ALT, ALKPHOS, BILITOT, PROT, ALBUMIN in the last 168 hours. No results for input(s): LIPASE, AMYLASE in the last 168 hours. No results for input(s): AMMONIA in the last 168 hours.  CBC: Recent Labs  Lab 01/15/21 0450  WBC 10.0  HGB 10.5*  HCT 35.3*  MCV 94.4  PLT 276    Cardiac Enzymes: No results for input(s): CKTOTAL, CKMB, CKMBINDEX, TROPONINI in the last 168 hours.  BNP (last 3 results) No results for input(s): BNP in the last 8760 hours.  ProBNP (last 3 results) No results for input(s): PROBNP in the last 8760 hours.  Radiological  Exams: No results found.  Assessment/Plan Active Problems:   NSCLC of left lung (HCC)   Acute on chronic respiratory failure with hypoxia (HCC)   Acute lower GI bleeding   COVID-19 virus infection   COPD, severe ([place])   1. Acute on chronic respiratory failure hypoxia we will continue with capping working on decannulation possibly tomorrow depending on secretions 2. Acute lower GI bleed no change we will continue with supportive care. 3. COVID-19 virus infection recovery 4. Severe COPD medical management 5. 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 The Patient requires high complexity decision making with multiple systems involvement.  Rounds were done with the Respiratory Therapy Director and Staff therapists and discussed with nursing staff also.  Allyne Gee, MD [facility] Pulmonary Critical Care Medicine Sleep Medicine

## 2021-01-21 ENCOUNTER — Other Ambulatory Visit (HOSPITAL_COMMUNITY): Payer: No Typology Code available for payment source

## 2021-01-21 DIAGNOSIS — J9621 Acute and chronic respiratory failure with hypoxia: Secondary | ICD-10-CM | POA: Diagnosis not present

## 2021-01-21 DIAGNOSIS — K922 Gastrointestinal hemorrhage, unspecified: Secondary | ICD-10-CM | POA: Diagnosis not present

## 2021-01-21 DIAGNOSIS — U071 COVID-19: Secondary | ICD-10-CM | POA: Diagnosis not present

## 2021-01-21 DIAGNOSIS — J449 Chronic obstructive pulmonary disease, unspecified: Secondary | ICD-10-CM | POA: Diagnosis not present

## 2021-01-21 LAB — PROTIME-INR
INR: 2.1 — ABNORMAL HIGH (ref 0.8–1.2)
Prothrombin Time: 23.1 seconds — ABNORMAL HIGH (ref 11.4–15.2)

## 2021-01-21 NOTE — Progress Notes (Signed)
Pulmonary Critical Care Medicine [place]   PULMONARY CRITICAL CARE SERVICE  PROGRESS NOTE  Date of Service: 01/21/2021  DION SIBAL  [id number]  DOB: 06-Dec-1945   DOA: 12/18/2020  Referring Physician: Merton Border, MD  HPI: Jamie Young is a 75 y.o. male seen for follow up of Acute on Chronic Respiratory Failure.  Patient is doing fine with capping supposed to have swallowing assessment done today  Medications: Reviewed on Rounds  Physical Exam:  Vitals: Temperature is 97.0 pulse 76 respiratory 16 blood pressure 149/76 saturation 90%  Ventilator Settings capping off the ventilator  . General: Comfortable at this time . Eyes: Grossly normal lids, irises & conjunctiva . ENT: grossly tongue is normal . Neck: no obvious mass . Cardiovascular: S1 S2 normal no gallop . Respiratory: No rhonchi no rales . Abdomen: soft . Skin: no rash seen on limited exam . Musculoskeletal: not rigid . Psychiatric:unable to assess . Neurologic: no seizure no involuntary movements         Lab Data:   Basic Metabolic Panel: Recent Labs  Lab 01/15/21 0450  NA 139  K 4.1  CL 102  CO2 28  GLUCOSE 161*  BUN 30*  CREATININE 0.44*  CALCIUM 9.3    ABG: No results for input(s): PHART, PCO2ART, PO2ART, HCO3, O2SAT in the last 168 hours.  Liver Function Tests: No results for input(s): AST, ALT, ALKPHOS, BILITOT, PROT, ALBUMIN in the last 168 hours. No results for input(s): LIPASE, AMYLASE in the last 168 hours. No results for input(s): AMMONIA in the last 168 hours.  CBC: Recent Labs  Lab 01/15/21 0450  WBC 10.0  HGB 10.5*  HCT 35.3*  MCV 94.4  PLT 276    Cardiac Enzymes: No results for input(s): CKTOTAL, CKMB, CKMBINDEX, TROPONINI in the last 168 hours.  BNP (last 3 results) No results for input(s): BNP in the last 8760 hours.  ProBNP (last 3 results) No results for input(s): PROBNP in the last 8760 hours.  Radiological Exams: No results  found.  Assessment/Plan Active Problems:   NSCLC of left lung (HCC)   Acute on chronic respiratory failure with hypoxia (HCC)   Acute lower GI bleeding   COVID-19 virus infection   COPD, severe ([place])   1. Acute on chronic respiratory failure hypoxia patient's doing well with capping if patient is able to pass a speech evaluation will probably be able to decannulate 2. Non-small cell cancer of the lung no change 3. Acute GI bleed supportive care 4. COVID-19 virus infection recovery 5. Severe COPD medical management   I have personally seen and evaluated the patient, evaluated laboratory and imaging results, formulated the assessment and plan and placed orders. The Patient requires high complexity decision making with multiple systems involvement.  Rounds were done with the Respiratory Therapy Director and Staff therapists and discussed with nursing staff also.  Allyne Gee, MD [facility] Pulmonary Critical Care Medicine Sleep Medicine

## 2021-01-22 DIAGNOSIS — J449 Chronic obstructive pulmonary disease, unspecified: Secondary | ICD-10-CM | POA: Diagnosis not present

## 2021-01-22 DIAGNOSIS — U071 COVID-19: Secondary | ICD-10-CM | POA: Diagnosis not present

## 2021-01-22 DIAGNOSIS — J9621 Acute and chronic respiratory failure with hypoxia: Secondary | ICD-10-CM | POA: Diagnosis not present

## 2021-01-22 DIAGNOSIS — K922 Gastrointestinal hemorrhage, unspecified: Secondary | ICD-10-CM | POA: Diagnosis not present

## 2021-01-22 LAB — PROTIME-INR
INR: 2.4 — ABNORMAL HIGH (ref 0.8–1.2)
Prothrombin Time: 25.3 seconds — ABNORMAL HIGH (ref 11.4–15.2)

## 2021-01-22 NOTE — Progress Notes (Signed)
Pulmonary Critical Care Medicine [place]   PULMONARY CRITICAL CARE SERVICE  PROGRESS NOTE  Date of Service: 01/22/2021  Jamie Young  [id number]  DOB: September 14, 1946   DOA: 12/18/2020  Referring Physician: Merton Border, MD  HPI: Jamie Young is a 75 y.o. male seen for follow up of Acute on Chronic Respiratory Failure.  Patient is capping had a swallowing test done yesterday which the patient failed  Medications: Reviewed on Rounds  Physical Exam:  Vitals: Temperature is 96.8 pulse 81 respiratory 18 blood pressure is 115/73 saturations 98%  Ventilator Settings capping off the ventilator  . General: Comfortable at this time . Eyes: Grossly normal lids, irises & conjunctiva . ENT: grossly tongue is normal . Neck: no obvious mass . Cardiovascular: S1 S2 normal no gallop . Respiratory: Scattered rhonchi very coarse breath sound . Abdomen: soft . Skin: no rash seen on limited exam . Musculoskeletal: not rigid . Psychiatric:unable to assess . Neurologic: no seizure no involuntary movements         Lab Data:   Basic Metabolic Panel: No results for input(s): NA, K, CL, CO2, GLUCOSE, BUN, CREATININE, CALCIUM, MG, PHOS in the last 168 hours.  ABG: No results for input(s): PHART, PCO2ART, PO2ART, HCO3, O2SAT in the last 168 hours.  Liver Function Tests: No results for input(s): AST, ALT, ALKPHOS, BILITOT, PROT, ALBUMIN in the last 168 hours. No results for input(s): LIPASE, AMYLASE in the last 168 hours. No results for input(s): AMMONIA in the last 168 hours.  CBC: No results for input(s): WBC, NEUTROABS, HGB, HCT, MCV, PLT in the last 168 hours.  Cardiac Enzymes: No results for input(s): CKTOTAL, CKMB, CKMBINDEX, TROPONINI in the last 168 hours.  BNP (last 3 results) No results for input(s): BNP in the last 8760 hours.  ProBNP (last 3 results) No results for input(s): PROBNP in the last 8760 hours.  Radiological Exams: No results  found.  Assessment/Plan Active Problems:   NSCLC of left lung (HCC)   Acute on chronic respiratory failure with hypoxia (HCC)   Acute lower GI bleeding   COVID-19 virus infection   COPD, severe ([place])   1. Acute on chronic respiratory failure hypoxia we will continue with patient failed swallowing assessment so now comes into question whether will be a candidate for decannulation 2. Non-small cell cancer of the lung we will continue to follow 3. Acute GI bleed no active bleeding 4. COVID-19 virus infection recovery 5. Severe COPD medical management   I have personally seen and evaluated the patient, evaluated laboratory and imaging results, formulated the assessment and plan and placed orders. The Patient requires high complexity decision making with multiple systems involvement.  Rounds were done with the Respiratory Therapy Director and Staff therapists and discussed with nursing staff also.  Allyne Gee, MD [facility] Pulmonary Critical Care Medicine Sleep Medicine

## 2021-01-23 DIAGNOSIS — J9621 Acute and chronic respiratory failure with hypoxia: Secondary | ICD-10-CM | POA: Diagnosis not present

## 2021-01-23 DIAGNOSIS — J449 Chronic obstructive pulmonary disease, unspecified: Secondary | ICD-10-CM | POA: Diagnosis not present

## 2021-01-23 DIAGNOSIS — K922 Gastrointestinal hemorrhage, unspecified: Secondary | ICD-10-CM | POA: Diagnosis not present

## 2021-01-23 DIAGNOSIS — U071 COVID-19: Secondary | ICD-10-CM | POA: Diagnosis not present

## 2021-01-23 LAB — PROTIME-INR
INR: 2.7 — ABNORMAL HIGH (ref 0.8–1.2)
Prothrombin Time: 27.5 seconds — ABNORMAL HIGH (ref 11.4–15.2)

## 2021-01-23 NOTE — Progress Notes (Signed)
Pulmonary Critical Care Medicine Jamie Young   PULMONARY CRITICAL CARE SERVICE  PROGRESS NOTE  Date of Service: 01/23/2021  Jamie Young  [id number]  DOB: 10-26-1946   DOA: 12/18/2020  Referring Physician: Merton Border, MD  HPI: Jamie Young is a 75 y.o. male seen for follow up of Acute on Chronic Respiratory Failure.  Patient currently is capping has been on room air good saturations are noted  Medications: Reviewed on Rounds  Physical Exam:  Vitals: Temperature is 97.4 pulse 75 respiratory rate is 20 blood pressure 150/82 saturations 100%  Ventilator Settings capping on room air  . General: Comfortable at this time . Eyes: Grossly normal lids, irises & conjunctiva . ENT: grossly tongue is normal . Neck: no obvious mass . Cardiovascular: S1 S2 normal no gallop . Respiratory: Scattered rhonchi expansion is equal . Abdomen: soft . Skin: no rash seen on limited exam . Musculoskeletal: not rigid . Psychiatric:unable to assess . Neurologic: no seizure no involuntary movements         Lab Data:   Basic Metabolic Panel: No results for input(s): NA, K, CL, CO2, GLUCOSE, BUN, CREATININE, CALCIUM, MG, PHOS in the last 168 hours.  ABG: No results for input(s): PHART, PCO2ART, PO2ART, HCO3, O2SAT in the last 168 hours.  Liver Function Tests: No results for input(s): AST, ALT, ALKPHOS, BILITOT, PROT, ALBUMIN in the last 168 hours. No results for input(s): LIPASE, AMYLASE in the last 168 hours. No results for input(s): AMMONIA in the last 168 hours.  CBC: No results for input(s): WBC, NEUTROABS, HGB, HCT, MCV, PLT in the last 168 hours.  Cardiac Enzymes: No results for input(s): CKTOTAL, CKMB, CKMBINDEX, TROPONINI in the last 168 hours.  BNP (last 3 results) No results for input(s): BNP in the last 8760 hours.  ProBNP (last 3 results) No results for input(s): PROBNP in the last 8760 hours.  Radiological Exams: No results  found.  Assessment/Plan Active Problems:   NSCLC of left lung (HCC)   Acute on chronic respiratory failure with hypoxia (HCC)   Acute lower GI bleeding   COVID-19 virus infection   COPD, severe ([place])   1. Acute on chronic respiratory failure hypoxia we will continue with capping trials as ordered.  Patient is being monitored for development of aspiration pneumonia 2. Acute GI bleed no change 3. COVID-19 virus infection recovery 4. Severe COPD medical management 5. Lung cancer status post resection   I have personally seen and evaluated the patient, evaluated laboratory and imaging results, formulated the assessment and plan and placed orders. The Patient requires high complexity decision making with multiple systems involvement.  Rounds were done with the Respiratory Therapy Director and Staff therapists and discussed with nursing staff also.  Allyne Gee, MD [facility] Pulmonary Critical Care Medicine Sleep Medicine

## 2021-01-24 DIAGNOSIS — J9621 Acute and chronic respiratory failure with hypoxia: Secondary | ICD-10-CM | POA: Diagnosis not present

## 2021-01-24 DIAGNOSIS — J449 Chronic obstructive pulmonary disease, unspecified: Secondary | ICD-10-CM | POA: Diagnosis not present

## 2021-01-24 DIAGNOSIS — U071 COVID-19: Secondary | ICD-10-CM | POA: Diagnosis not present

## 2021-01-24 DIAGNOSIS — K922 Gastrointestinal hemorrhage, unspecified: Secondary | ICD-10-CM | POA: Diagnosis not present

## 2021-01-24 LAB — PROTIME-INR
INR: 2.9 — ABNORMAL HIGH (ref 0.8–1.2)
Prothrombin Time: 29.1 seconds — ABNORMAL HIGH (ref 11.4–15.2)

## 2021-01-24 NOTE — Progress Notes (Signed)
Pulmonary Critical Care Medicine [place]   PULMONARY CRITICAL CARE SERVICE  PROGRESS NOTE  Date of Service: 01/24/2021  Jamie Young  SPQ:[id number]  DOB: 1945/12/17   DOA: 12/18/2020  Referring Physician: Merton Border, MD  HPI: Jamie Young is a 75 y.o. male seen for follow up of Acute on Chronic Respiratory Failure.  Patient currently is afebrile capping on room air looks comfortable secretions are reportedly minimal  Medications: Reviewed on Rounds  Physical Exam:  Vitals: Temperature is 98.0 pulse 81 respiratory 20 blood pressure is 142/63 saturations 100%  Ventilator Settings capping off the ventilator now for many days  . General: Comfortable at this time . Eyes: Grossly normal lids, irises & conjunctiva . ENT: grossly tongue is normal . Neck: no obvious mass . Cardiovascular: S1 S2 normal no gallop . Respiratory: Scattered rhonchi expansion is equal . Abdomen: soft . Skin: no rash seen on limited exam . Musculoskeletal: not rigid . Psychiatric:unable to assess . Neurologic: no seizure no involuntary movements         Lab Data:   Basic Metabolic Panel: No results for input(s): NA, K, CL, CO2, GLUCOSE, BUN, CREATININE, CALCIUM, MG, PHOS in the last 168 hours.  ABG: No results for input(s): PHART, PCO2ART, PO2ART, HCO3, O2SAT in the last 168 hours.  Liver Function Tests: No results for input(s): AST, ALT, ALKPHOS, BILITOT, PROT, ALBUMIN in the last 168 hours. No results for input(s): LIPASE, AMYLASE in the last 168 hours. No results for input(s): AMMONIA in the last 168 hours.  CBC: No results for input(s): WBC, NEUTROABS, HGB, HCT, MCV, PLT in the last 168 hours.  Cardiac Enzymes: No results for input(s): CKTOTAL, CKMB, CKMBINDEX, TROPONINI in the last 168 hours.  BNP (last 3 results) No results for input(s): BNP in the last 8760 hours.  ProBNP (last 3 results) No results for input(s): PROBNP in the last 8760  hours.  Radiological Exams: No results found.  Assessment/Plan Active Problems:   NSCLC of left lung (HCC)   Acute on chronic respiratory failure with hypoxia (HCC)   Acute lower GI bleeding   COVID-19 virus infection   COPD, severe ([place])   1. Acute on chronic respiratory failure hypoxia patient has been capping doing well with no issues with secretions may be a good candidate for decannulation will discuss further with multidisciplinary team 2. Non-small cell cancer of the lung no change 3. Acute GI bleed patient is at baseline 4. COVID-19 virus infection recovery 5. Severe COPD medical management   I have personally seen and evaluated the patient, evaluated laboratory and imaging results, formulated the assessment and plan and placed orders. The Patient requires high complexity decision making with multiple systems involvement.  Rounds were done with the Respiratory Therapy Director and Staff therapists and discussed with nursing staff also.  Allyne Gee, MD [facility] Pulmonary Critical Care Medicine Sleep Medicine

## 2021-01-25 DIAGNOSIS — J449 Chronic obstructive pulmonary disease, unspecified: Secondary | ICD-10-CM | POA: Diagnosis not present

## 2021-01-25 DIAGNOSIS — K922 Gastrointestinal hemorrhage, unspecified: Secondary | ICD-10-CM | POA: Diagnosis not present

## 2021-01-25 DIAGNOSIS — J9621 Acute and chronic respiratory failure with hypoxia: Secondary | ICD-10-CM | POA: Diagnosis not present

## 2021-01-25 DIAGNOSIS — U071 COVID-19: Secondary | ICD-10-CM | POA: Diagnosis not present

## 2021-01-25 LAB — PROTIME-INR
INR: 2.3 — ABNORMAL HIGH (ref 0.8–1.2)
Prothrombin Time: 24.7 seconds — ABNORMAL HIGH (ref 11.4–15.2)

## 2021-01-25 LAB — CBC
HCT: 43.4 % (ref 39.0–52.0)
Hemoglobin: 13 g/dL (ref 13.0–17.0)
MCH: 28 pg (ref 26.0–34.0)
MCHC: 30 g/dL (ref 30.0–36.0)
MCV: 93.3 fL (ref 80.0–100.0)
Platelets: 335 10*3/uL (ref 150–400)
RBC: 4.65 MIL/uL (ref 4.22–5.81)
RDW: 16.8 % — ABNORMAL HIGH (ref 11.5–15.5)
WBC: 10.7 10*3/uL — ABNORMAL HIGH (ref 4.0–10.5)
nRBC: 0 % (ref 0.0–0.2)

## 2021-01-25 LAB — BASIC METABOLIC PANEL
Anion gap: 9 (ref 5–15)
BUN: 43 mg/dL — ABNORMAL HIGH (ref 8–23)
CO2: 30 mmol/L (ref 22–32)
Calcium: 10.2 mg/dL (ref 8.9–10.3)
Chloride: 102 mmol/L (ref 98–111)
Creatinine, Ser: 0.42 mg/dL — ABNORMAL LOW (ref 0.61–1.24)
GFR, Estimated: >60 mL/min (ref >60)
Glucose, Bld: 127 mg/dL — ABNORMAL HIGH (ref 70–99)
Potassium: 4 mmol/L (ref 3.5–5.1)
Sodium: 141 mmol/L (ref 135–145)

## 2021-01-25 NOTE — Progress Notes (Signed)
Pulmonary Critical Care Medicine [place]   PULMONARY CRITICAL CARE SERVICE  PROGRESS NOTE  Date of Service: 01/25/2021  DEMETRIC DUNNAWAY  KLK:[id number]  DOB: Apr 07, 1946   DOA: 12/18/2020  Referring Physician: Merton Border, MD  HPI: Jamie Young is a 75 y.o. male seen for follow up of Acute on Chronic Respiratory Failure.  Patient right now is afebrile comfortable without distress at this time.  Medications: Reviewed on Rounds  Physical Exam:  Vitals: Temperature is 96.8 pulse 78 respiratory 16 blood pressure is 133/68 saturations 99%  Ventilator Settings capping off the ventilator  . General: Comfortable at this time . Eyes: Grossly normal lids, irises & conjunctiva . ENT: grossly tongue is normal . Neck: no obvious mass . Cardiovascular: S1 S2 normal no gallop . Respiratory: No rhonchi no rales noted at this time . Abdomen: soft . Skin: no rash seen on limited exam . Musculoskeletal: not rigid . Psychiatric:unable to assess . Neurologic: no seizure no involuntary movements         Lab Data:   Basic Metabolic Panel: Recent Labs  Lab 01/25/21 0425  NA 141  K 4.0  CL 102  CO2 30  GLUCOSE 127*  BUN 43*  CREATININE 0.42*  CALCIUM 10.2    ABG: No results for input(s): PHART, PCO2ART, PO2ART, HCO3, O2SAT in the last 168 hours.  Liver Function Tests: No results for input(s): AST, ALT, ALKPHOS, BILITOT, PROT, ALBUMIN in the last 168 hours. No results for input(s): LIPASE, AMYLASE in the last 168 hours. No results for input(s): AMMONIA in the last 168 hours.  CBC: Recent Labs  Lab 01/25/21 0425  WBC 10.7*  HGB 13.0  HCT 43.4  MCV 93.3  PLT 335    Cardiac Enzymes: No results for input(s): CKTOTAL, CKMB, CKMBINDEX, TROPONINI in the last 168 hours.  BNP (last 3 results) No results for input(s): BNP in the last 8760 hours.  ProBNP (last 3 results) No results for input(s): PROBNP in the last 8760 hours.  Radiological Exams: No  results found.  Assessment/Plan Active Problems:   NSCLC of left lung (HCC)   Acute on chronic respiratory failure with hypoxia (HCC)   Acute lower GI bleeding   COVID-19 virus infection   COPD, severe ([place])   1. Acute on chronic respiratory failure hypoxia patient has been doing very well with the capping although the patient failed the swallowing assessment last week he has minimal secretions he does have a good strong cough and is able to clear his secretions.  The patient's wife now is having second thoughts as far as leaving the trach and then she would prefer that he be decannulated.  From a clinical perspective I think he is ready for decannulation 2. Non-small cell cancer of the lung supportive care 3. Acute GI bleed no change 4. COVID-19 virus infection recovery 5. Severe COPD we will continue with medical management   I have personally seen and evaluated the patient, evaluated laboratory and imaging results, formulated the assessment and plan and placed orders. The Patient requires high complexity decision making with multiple systems involvement.  Rounds were done with the Respiratory Therapy Director and Staff therapists and discussed with nursing staff also.  Allyne Gee, MD [facility] Pulmonary Critical Care Medicine Sleep Medicine

## 2021-01-26 LAB — PROTIME-INR
INR: 1.7 — ABNORMAL HIGH (ref 0.8–1.2)
Prothrombin Time: 19.4 seconds — ABNORMAL HIGH (ref 11.4–15.2)

## 2022-09-02 IMAGING — DX DG CHEST 1V PORT
1 series · 1 of 1 positions shown · non-contrast
Comparison: 11/08/2020

CLINICAL DATA: Respiratory failure, enteric catheter placement

EXAM:
PORTABLE CHEST 1 VIEW

[chest]
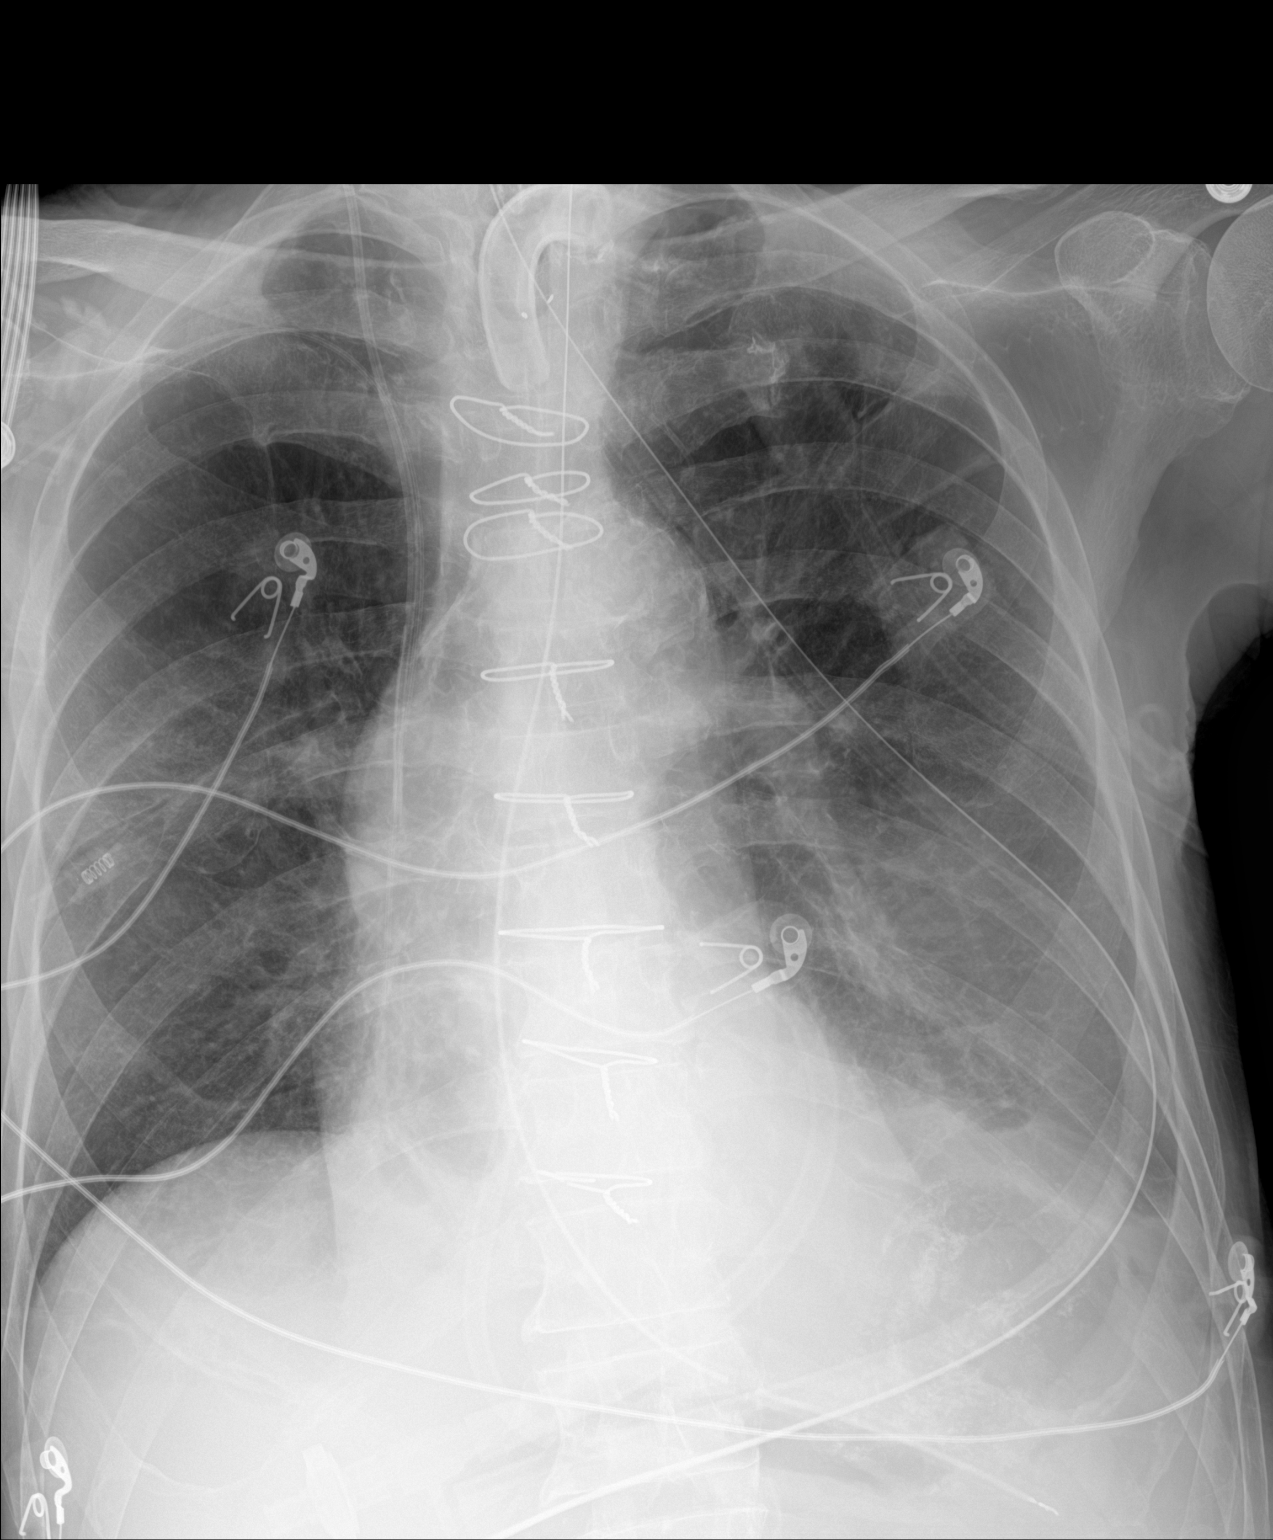

[1 of 1 positions shown; findings below may reference images not displayed]

FINDINGS: Single frontal view of the chest demonstrates tracheostomy tube
overlying tracheal air column. Enteric catheter passes below
diaphragm tip and side port projecting over gastric fundus. Right
internal jugular catheter and right-sided PICC are noted, tips
overlying superior vena cava. Cardiac silhouette is stable. Patchy
consolidation at the left lung base. Trace left effusion is
suspected. No pneumothorax.
IMPRESSION: 1. Patchy left basilar consolidation and trace left pleural
effusion, which could reflect pneumonia in the appropriate setting.
2. Support devices as above.

## 2022-09-04 IMAGING — CT CT HEAD W/O CM
4 series · 17 of 47 positions shown, 19 images · non-contrast
Comparison: 11/07/2020, 11/08/2020

CLINICAL DATA: Altered level of consciousness, history of
respiratory failure and COVID 19

EXAM:
CT HEAD WITHOUT CONTRAST
TECHNIQUE: Contiguous axial images were obtained from the base of the skull
through the vertex without intravenous contrast.

[Series 3: head without · axial · non-contrast · 0.47mm/px · z∈[-51,+69]mm · 7 of 32 slices shown, 9 images]
[im 4/32  brain]
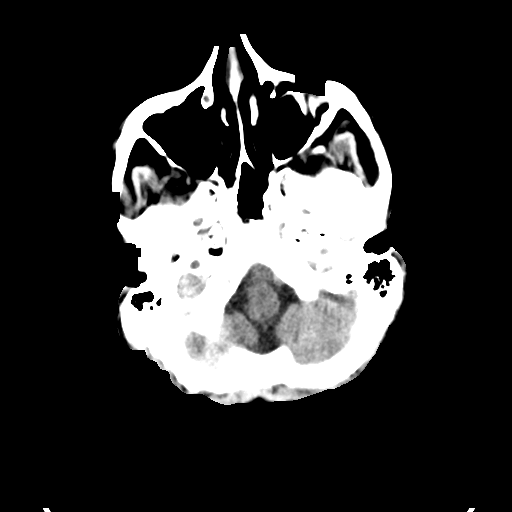
[im 4/32  bone]
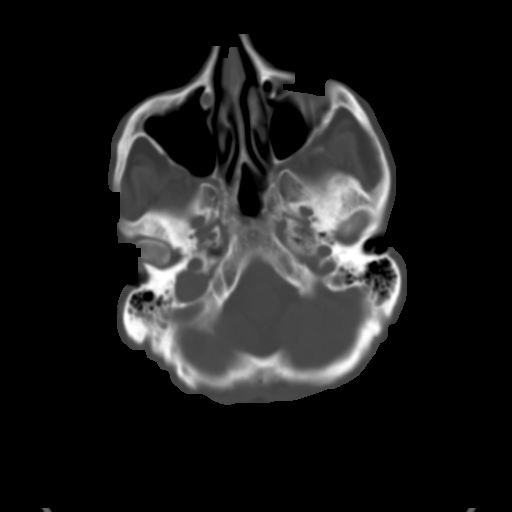
[im 8/32  brain]
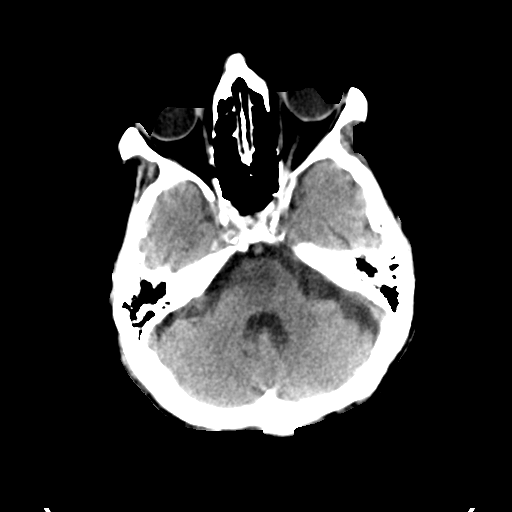
[im 12/32  brain]
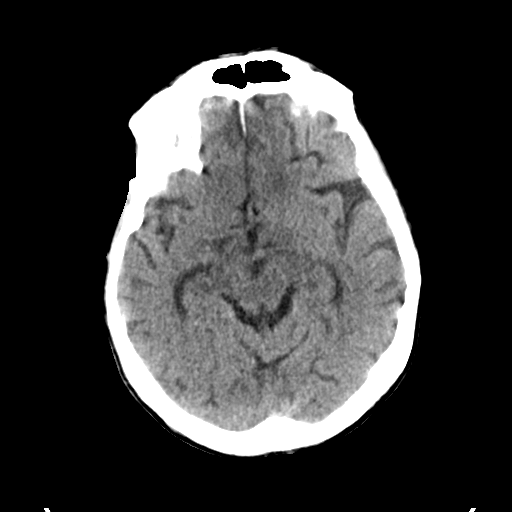
[im 16/32  brain]
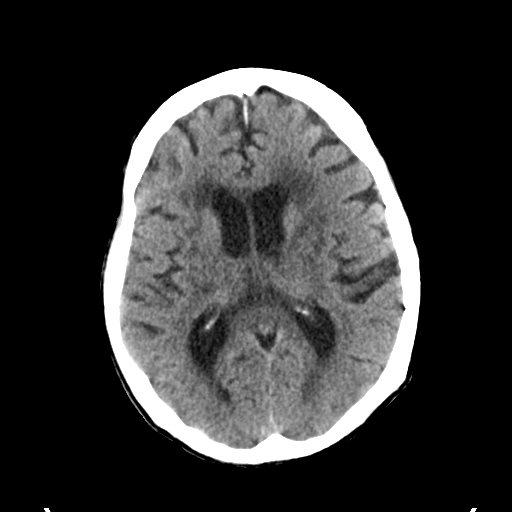
[im 20/32  brain]
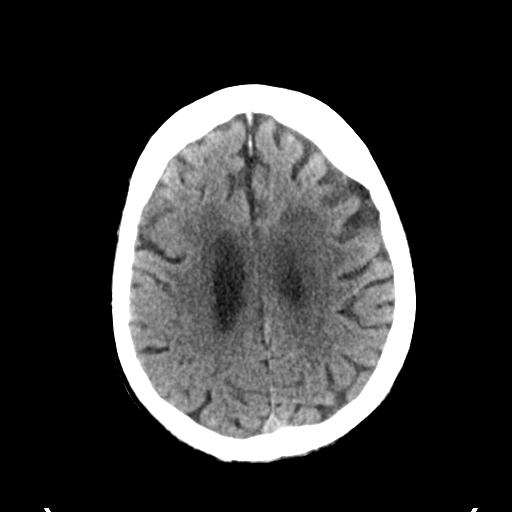
[im 20/32  bone]
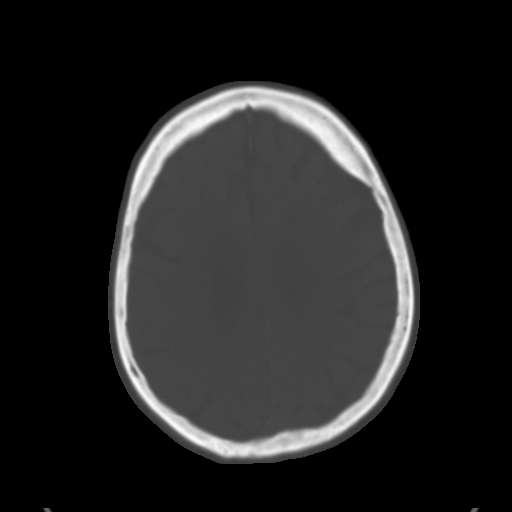
[im 24/32  brain]
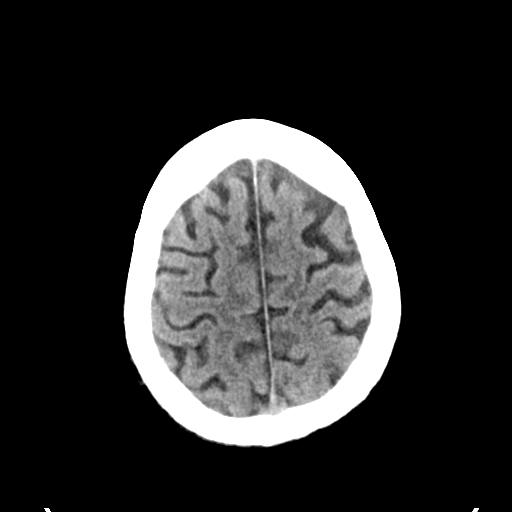
[im 28/32  brain]
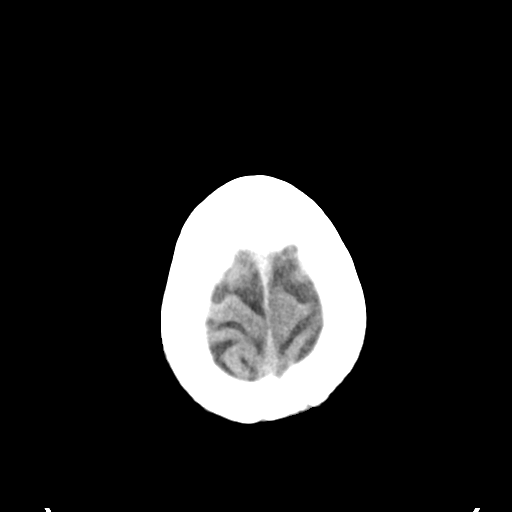

[Series 4: head bone · axial · 0.47mm/px · z∈[-52,+4]mm · 4 of 80 slices shown]
[im 8/80  bone]
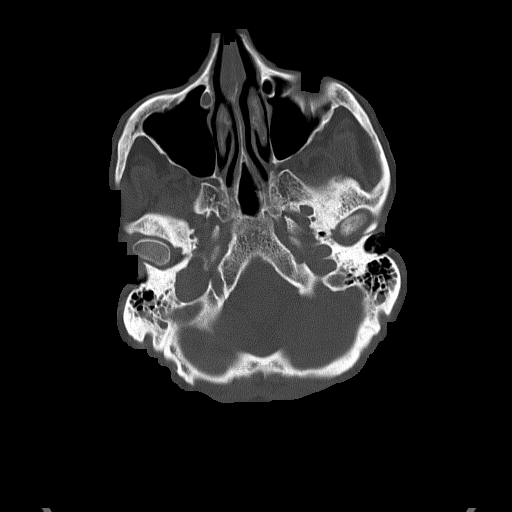
[im 16/80  bone]
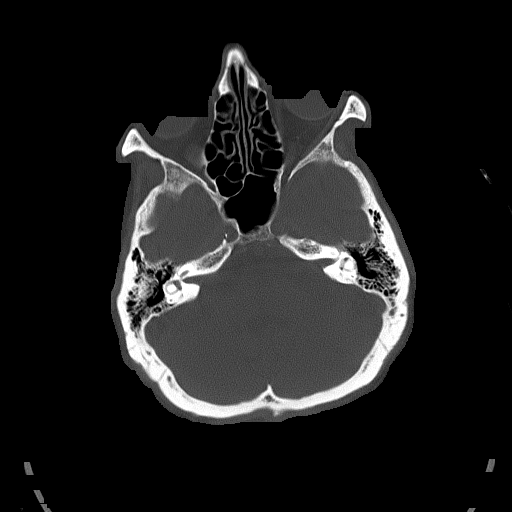
[im 24/80  bone]
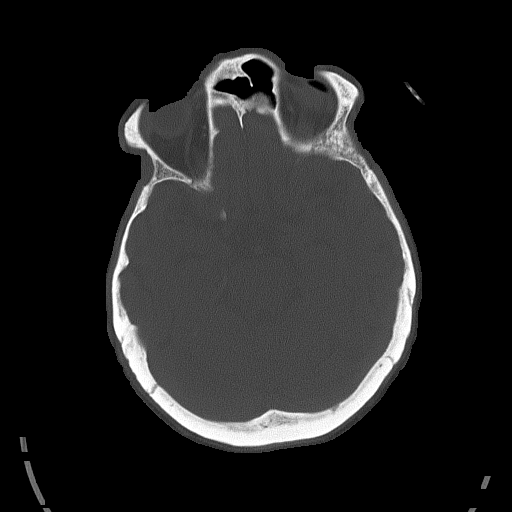
[im 36/80  bone]
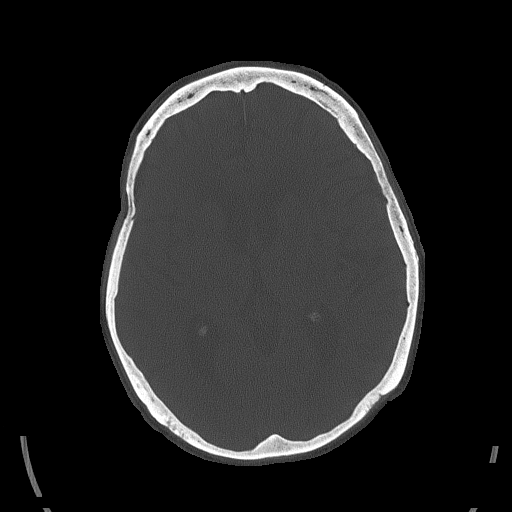

[Series 5: head without cor · coronal · non-contrast · 0.35mm/px · 3 of 72 slices shown]
[im 24/72  brain]
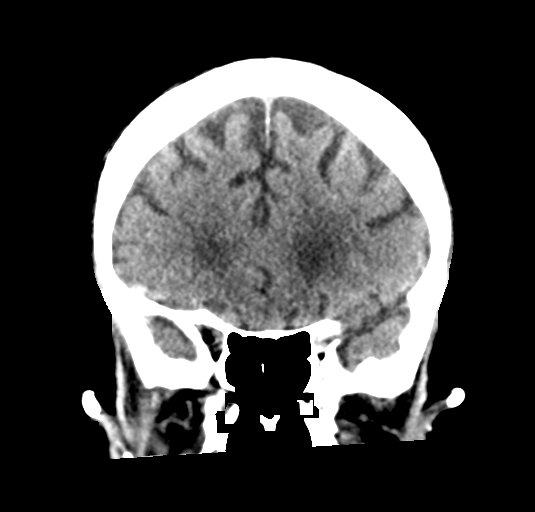
[im 32/72  brain]
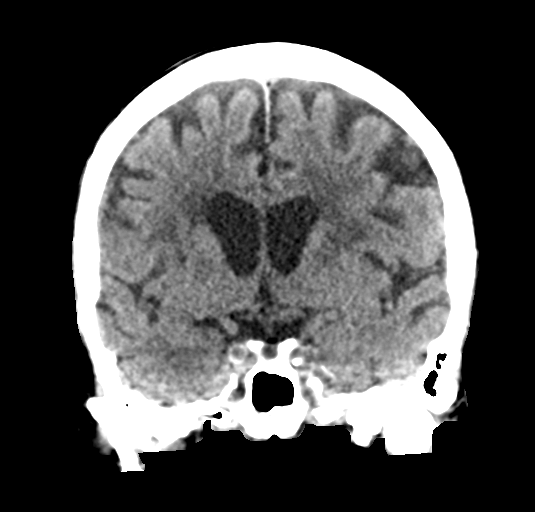
[im 40/72  brain]
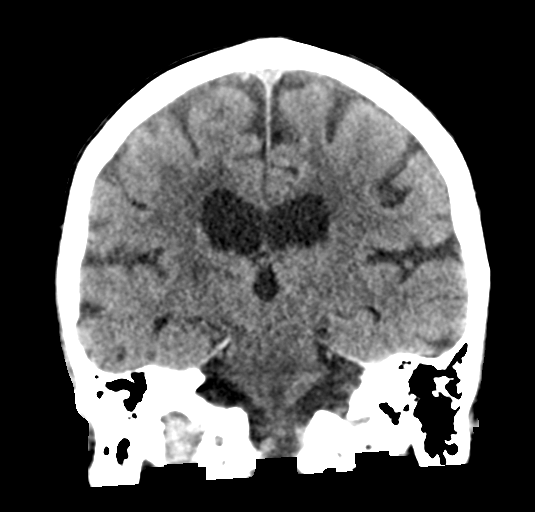

[Series 6: head without sag · sagittal · non-contrast · 0.35mm/px · 3 of 60 slices shown]
[im 20/60  brain]
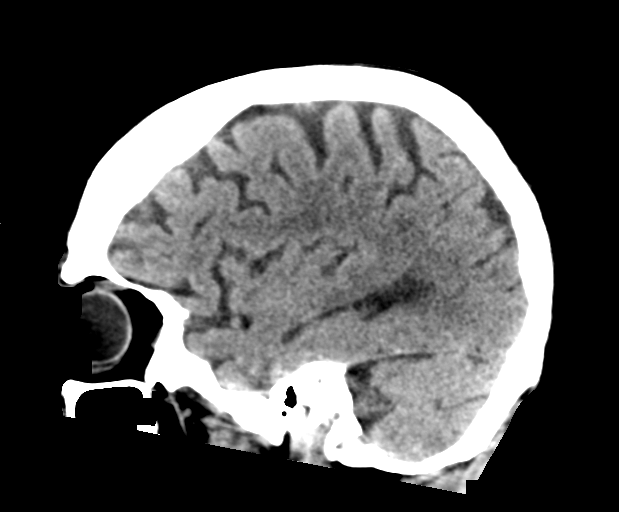
[im 30/60  brain]
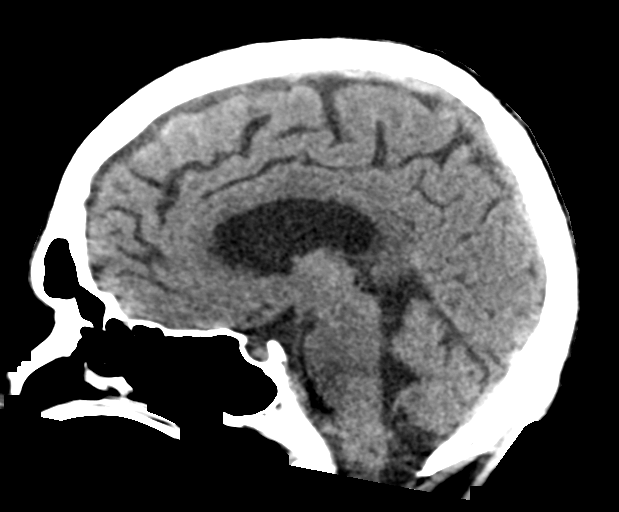
[im 40/60  brain]
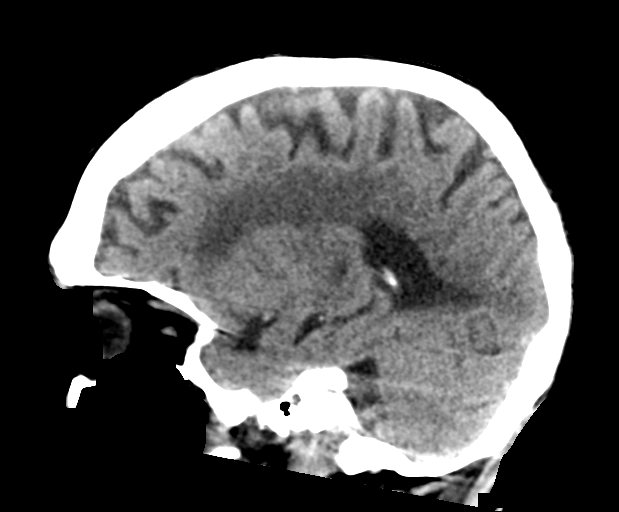

[17 of 47 positions shown; findings below may reference images not displayed]

FINDINGS: Brain: Stable hypodensities throughout the periventricular white
matter and basal ganglia consistent with chronic small vessel
ischemic changes. No acute infarct or hemorrhage. Lateral ventricles
and remaining midline structures are unremarkable. No acute
extra-axial fluid collections. No mass effect.

Vascular: No hyperdense vessel or unexpected calcification.

Skull: Normal. Negative for fracture or focal lesion.

Sinuses/Orbits: No acute finding.

Other: None.
IMPRESSION: 1. Chronic small-vessel ischemic changes throughout the white matter
and basal ganglia. No acute intracranial process.

## 2022-09-04 IMAGING — DX DG CHEST 1V PORT
1 series · 1 of 1 positions shown · non-contrast
Comparison: Chest radiograph dated 12/18/2020.

CLINICAL DATA: 74-year-old male with leukocytosis.

EXAM:
PORTABLE CHEST 1 VIEW

[chest]
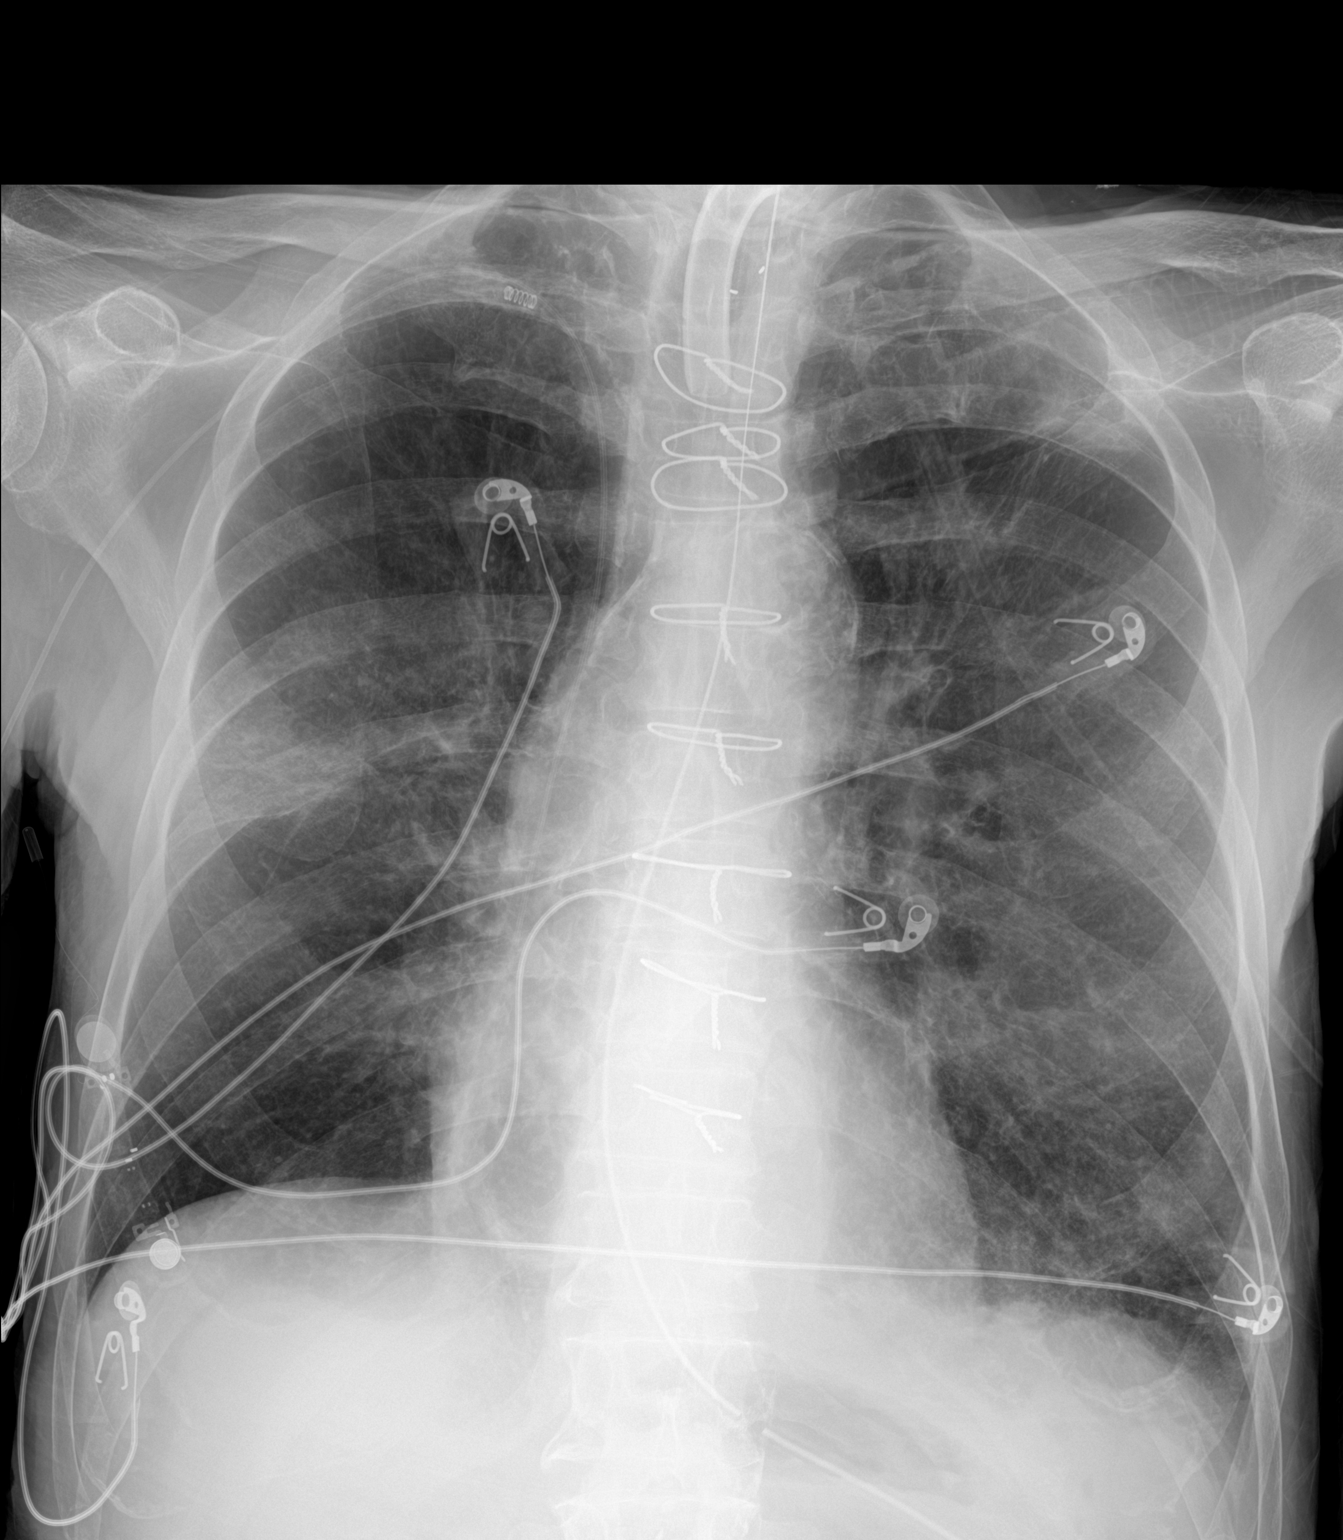

[1 of 1 positions shown; findings below may reference images not displayed]

FINDINGS: Tracheostomy above the carina in similar position. Enteric tube
extends below the diaphragm with side-port in the proximal stomach
and tip beyond the inferior margin of the image. Right-sided PICC in
similar position. Improved aeration of the left lung base compared
to prior radiograph. Small faint bilateral opacities primarily
involving the right mid lung field and left lower lung field remain.
No pleural effusion or pneumothorax. Stable cardiac silhouette.
Median sternotomy wires and CABG vascular clips. Atherosclerotic
calcification of the aorta. No acute osseous pathology.
IMPRESSION: Improved aeration of the left lung base compared to prior
radiograph.
